# Patient Record
Sex: Male | Born: 1960 | Race: White | Hispanic: No | Smoking: Never smoker
Health system: Southern US, Community
[De-identification: ages and names within clinical notes are randomized; demographics above are authoritative.]

## PROBLEM LIST (undated history)

## (undated) DIAGNOSIS — K56609 Unspecified intestinal obstruction, unspecified as to partial versus complete obstruction: Secondary | ICD-10-CM

## (undated) DIAGNOSIS — E785 Hyperlipidemia, unspecified: Secondary | ICD-10-CM

## (undated) DIAGNOSIS — I10 Essential (primary) hypertension: Secondary | ICD-10-CM

## (undated) DIAGNOSIS — R9431 Abnormal electrocardiogram [ECG] [EKG]: Secondary | ICD-10-CM

## (undated) HISTORY — DX: Essential (primary) hypertension: I10

## (undated) HISTORY — DX: Abnormal electrocardiogram (ECG) (EKG): R94.31

## (undated) HISTORY — PX: VASECTOMY: SHX75

## (undated) HISTORY — DX: Hyperlipidemia, unspecified: E78.5

## (undated) HISTORY — DX: Unspecified intestinal obstruction, unspecified as to partial versus complete obstruction: K56.609

---

## 2003-12-13 ENCOUNTER — Emergency Department (HOSPITAL_COMMUNITY): Admission: EM | Admit: 2003-12-13 | Discharge: 2003-12-13 | Payer: Self-pay | Admitting: Emergency Medicine

## 2014-03-01 ENCOUNTER — Ambulatory Visit (INDEPENDENT_AMBULATORY_CARE_PROVIDER_SITE_OTHER): Payer: BC Managed Care – PPO

## 2014-03-01 ENCOUNTER — Ambulatory Visit (INDEPENDENT_AMBULATORY_CARE_PROVIDER_SITE_OTHER): Payer: BC Managed Care – PPO | Admitting: Podiatry

## 2014-03-01 ENCOUNTER — Encounter: Payer: Self-pay | Admitting: Podiatry

## 2014-03-01 VITALS — BP 178/92 | HR 58 | Resp 16 | Ht 68.0 in | Wt 183.0 lb

## 2014-03-01 DIAGNOSIS — M216X9 Other acquired deformities of unspecified foot: Secondary | ICD-10-CM

## 2014-03-01 DIAGNOSIS — M779 Enthesopathy, unspecified: Secondary | ICD-10-CM

## 2014-03-01 DIAGNOSIS — Q667 Congenital pes cavus: Secondary | ICD-10-CM

## 2014-03-01 MED ORDER — TRIAMCINOLONE ACETONIDE 10 MG/ML IJ SUSP
10.0000 mg | Freq: Once | INTRAMUSCULAR | Status: AC
  Administered 2014-03-01: 10 mg

## 2014-03-01 NOTE — Progress Notes (Signed)
Subjective:     Patient ID: Devon Riley, male   DOB: 02/10/1961, 53 y.o.   MRN: [id number]  Foot Pain  Patient presents stating I have a lot of pain under my 5th metatarsal of my left foot. States it has been present for about 4 months and getting worse   Review of Systems  All other systems reviewed and are negative.      Objective:   Physical Exam  Nursing note and vitals reviewed. Constitutional: He is oriented to person, place, and time.  Cardiovascular: Intact distal pulses.   Musculoskeletal: Normal range of motion.  Neurological: He is oriented to person, place, and time.  Skin: Skin is warm.  Neurovascular status is intact with normal range of motion and muscle strength. Patient is well oriented x3 and has good digital perfusion. He has pain with swelling under the 5th metatarsal left foot with lesion formation.      Assessment:     Plantarflexed 5th metatarsal left with inflamed capsule    Plan:     h &p performed and xrays reviewed. Injection of left 5th met with 3mg. Of steroid and xylocaine. After numbness obtained patient had area trimmed and will reappoint as needed.      

## 2014-03-01 NOTE — Progress Notes (Signed)
   Subjective:    Patient ID: Devon RumpJames W Riley, male    DOB: 01/22/1961, 53 y.o.   MRN: [id number]  HPI Comments: "I have something on the bottom of my foot"  Patient c/o burning sub 5th MPJ left for about 4 months. The area is callused. Getting more painful when walking. No home treatment.  Foot Pain      Review of Systems  Musculoskeletal: Positive for gait problem.  All other systems reviewed and are negative.      Objective:   Physical Exam        Assessment & Plan:

## 2016-05-27 DIAGNOSIS — I10 Essential (primary) hypertension: Secondary | ICD-10-CM | POA: Diagnosis present

## 2016-08-11 ENCOUNTER — Emergency Department (HOSPITAL_COMMUNITY)
Admission: EM | Admit: 2016-08-11 | Discharge: 2016-08-11 | Disposition: A | Discharge status: Home / Self Care | Payer: BLUE CROSS/BLUE SHIELD | Source: Home / Self Care | Attending: Emergency Medicine | Admitting: Emergency Medicine

## 2016-08-11 ENCOUNTER — Encounter (HOSPITAL_COMMUNITY): Payer: Self-pay

## 2016-08-11 ENCOUNTER — Emergency Department (HOSPITAL_COMMUNITY): Payer: BLUE CROSS/BLUE SHIELD

## 2016-08-11 ENCOUNTER — Encounter: Payer: Self-pay | Admitting: Gastroenterology

## 2016-08-11 ENCOUNTER — Encounter (HOSPITAL_COMMUNITY): Payer: Self-pay | Admitting: Emergency Medicine

## 2016-08-11 DIAGNOSIS — Z79899 Other long term (current) drug therapy: Secondary | ICD-10-CM | POA: Insufficient documentation

## 2016-08-11 DIAGNOSIS — I10 Essential (primary) hypertension: Secondary | ICD-10-CM | POA: Diagnosis not present

## 2016-08-11 DIAGNOSIS — K219 Gastro-esophageal reflux disease without esophagitis: Secondary | ICD-10-CM | POA: Insufficient documentation

## 2016-08-11 DIAGNOSIS — R1010 Upper abdominal pain, unspecified: Secondary | ICD-10-CM

## 2016-08-11 DIAGNOSIS — R001 Bradycardia, unspecified: Secondary | ICD-10-CM | POA: Diagnosis not present

## 2016-08-11 DIAGNOSIS — K529 Noninfective gastroenteritis and colitis, unspecified: Principal | ICD-10-CM | POA: Insufficient documentation

## 2016-08-11 LAB — COMPREHENSIVE METABOLIC PANEL
ALK PHOS: 43 U/L (ref 38–126)
ALT: 19 U/L (ref 17–63)
ALT: 18 U/L (ref 17–63)
AST: 24 U/L (ref 15–41)
AST: 25 U/L (ref 15–41)
Albumin: 4.1 g/dL (ref 3.5–5.0)
Albumin: 4 g/dL (ref 3.5–5.0)
Alkaline Phosphatase: 42 U/L (ref 38–126)
Anion gap: 10 (ref 5–15)
Anion gap: 7 (ref 5–15)
BILIRUBIN TOTAL: 1 mg/dL (ref 0.3–1.2)
BILIRUBIN TOTAL: 0.7 mg/dL (ref 0.3–1.2)
BUN: 11 mg/dL (ref 6–20)
BUN: 9 mg/dL (ref 6–20)
CALCIUM: 9.4 mg/dL (ref 8.9–10.3)
CALCIUM: 9 mg/dL (ref 8.9–10.3)
CHLORIDE: 106 mmol/L (ref 101–111)
CHLORIDE: 108 mmol/L (ref 101–111)
CO2: 21 mmol/L — AB (ref 22–32)
CO2: 23 mmol/L (ref 22–32)
CREATININE: 0.96 mg/dL (ref 0.61–1.24)
CREATININE: 1.04 mg/dL (ref 0.61–1.24)
GFR calc Af Amer: >60 mL/min (ref >60)
GLUCOSE: 117 mg/dL — AB (ref 65–99)
Glucose, Bld: 123 mg/dL — ABNORMAL HIGH (ref 65–99)
Potassium: 4 mmol/L (ref 3.5–5.1)
Potassium: 3.9 mmol/L (ref 3.5–5.1)
Sodium: 137 mmol/L (ref 135–145)
Sodium: 138 mmol/L (ref 135–145)
TOTAL PROTEIN: 7.6 g/dL (ref 6.5–8.1)
Total Protein: 7.3 g/dL (ref 6.5–8.1)

## 2016-08-11 LAB — URINALYSIS, ROUTINE W REFLEX MICROSCOPIC
BACTERIA UA: NONE SEEN
BILIRUBIN URINE: NEGATIVE
GLUCOSE, UA: NEGATIVE mg/dL
Glucose, UA: NEGATIVE mg/dL
HGB URINE DIPSTICK: NEGATIVE
Hgb urine dipstick: NEGATIVE
Ketones, ur: 15 mg/dL — AB
Ketones, ur: NEGATIVE mg/dL
LEUKOCYTES UA: NEGATIVE
Leukocytes, UA: NEGATIVE
Nitrite: NEGATIVE
Nitrite: NEGATIVE
PROTEIN: NEGATIVE mg/dL
PROTEIN: 30 mg/dL — AB
RBC / HPF: NONE SEEN RBC/hpf (ref 0–5)
SPECIFIC GRAVITY, URINE: 1.042 — AB (ref 1.005–1.030)
SQUAMOUS EPITHELIAL / LPF: NONE SEEN
pH: 5.5 (ref 5.0–8.0)
pH: 5 (ref 5.0–8.0)

## 2016-08-11 LAB — CBC
HCT: 39.6 % (ref 39.0–52.0)
HEMATOCRIT: 38.2 % — AB (ref 39.0–52.0)
HEMOGLOBIN: 13.3 g/dL (ref 13.0–17.0)
Hemoglobin: 13.7 g/dL (ref 13.0–17.0)
MCH: 32.4 pg (ref 26.0–34.0)
MCH: 32.9 pg (ref 26.0–34.0)
MCHC: 34.6 g/dL (ref 30.0–36.0)
MCHC: 34.8 g/dL (ref 30.0–36.0)
MCV: 93.6 fL (ref 78.0–100.0)
MCV: 94.6 fL (ref 78.0–100.0)
PLATELETS: 222 10*3/uL (ref 150–400)
Platelets: 219 10*3/uL (ref 150–400)
RBC: 4.23 MIL/uL (ref 4.22–5.81)
RBC: 4.04 MIL/uL — ABNORMAL LOW (ref 4.22–5.81)
RDW: 13.4 % (ref 11.5–15.5)
RDW: 13.3 % (ref 11.5–15.5)
WBC: 8.9 10*3/uL (ref 4.0–10.5)
WBC: 8.7 10*3/uL (ref 4.0–10.5)

## 2016-08-11 LAB — LIPASE, BLOOD
LIPASE: 21 U/L (ref 11–51)
Lipase: 21 U/L (ref 11–51)

## 2016-08-11 MED ORDER — ONDANSETRON HCL 4 MG/2ML IJ SOLN
4.0000 mg | Freq: Once | INTRAMUSCULAR | Status: AC
  Administered 2016-08-11: 4 mg via INTRAVENOUS
  Filled 2016-08-11: qty 2

## 2016-08-11 MED ORDER — PANTOPRAZOLE SODIUM 40 MG PO TBEC: 40.0000 mg | DELAYED_RELEASE_TABLET | Freq: Every day | ORAL | 0 refills | Status: DC

## 2016-08-11 MED ORDER — ALUM & MAG HYDROXIDE-SIMETH 200-200-20 MG/5ML PO SUSP
30.0000 mL | Freq: Once | ORAL | Status: AC
  Administered 2016-08-11: 30 mL via ORAL
  Filled 2016-08-11: qty 30

## 2016-08-11 MED ORDER — IOPAMIDOL (ISOVUE-300) INJECTION 61%
INTRAVENOUS | Status: AC
  Administered 2016-08-11: 100 mL
  Filled 2016-08-11: qty 100

## 2016-08-11 MED ORDER — SODIUM CHLORIDE 0.9 % IV BOLUS (SEPSIS)
1000.0000 mL | Freq: Once | INTRAVENOUS | Status: AC
  Administered 2016-08-11: 1000 mL via INTRAVENOUS

## 2016-08-11 MED ORDER — HYDROMORPHONE HCL 1 MG/ML IJ SOLN
1.0000 mg | Freq: Once | INTRAMUSCULAR | Status: AC
  Administered 2016-08-11: 1 mg via INTRAMUSCULAR
  Filled 2016-08-11: qty 1

## 2016-08-11 MED ORDER — FAMOTIDINE IN NACL 20-0.9 MG/50ML-% IV SOLN
20.0000 mg | Freq: Once | INTRAVENOUS | Status: AC
  Administered 2016-08-11: 20 mg via INTRAVENOUS
  Filled 2016-08-11: qty 50

## 2016-08-11 NOTE — Discharge Instructions (Signed)
It was our pleasure to provide your ER care today - we hope that you feel better.  Drink plenty of fluids.  Try taking protonix (acid blocker medication).  You may also try maalox or mylanta for symptom relief.  Follow up with GI specialist in the next couple weeks - call office to arrange appointment.  Return to ER if worse, new symptoms, fevers, persistent vomiting, severe pain, other concern.   You were given pain medication in the ER - no driving for the next 4 hours.

## 2016-08-11 NOTE — ED Notes (Signed)
Patient to CT.

## 2016-08-11 NOTE — ED Triage Notes (Signed)
Pt was seen today for same sx of server abdominal cramping but was not given a dx; pt states cramping has not decreased since discharge home; pt c/o pain at 7/10 on arrival. Pt able to ambulate an speaking in full and complete sentences; pt c/o nausea and diarrhea; pt has negative CT scan today;

## 2016-08-11 NOTE — ED Triage Notes (Signed)
Pt states abdominal cramping began yesterday morning upon waking. Pt endorses nausea and diarrhea, denies vomiting.

## 2016-08-11 NOTE — ED Provider Notes (Signed)
[facility]EMERGENCY DEPT Provider Note   CSN: [id number] Arrival date & time: 08/11/16  4098     History   Chief Complaint Chief Complaint  Patient presents with  . Abdominal Pain    HPI HANZEL PIZZO is a 56 y.o. male.  Patient c/o abdominal pain and nausea, onset yesterday AM.  Did have 2 loose bms/diarrhea, no severe diarrhea. Pain is dull, cramping, moderate, persistent, worse today. Pain is diffuse, but worse towards upper abdomen. No hx pud, gallstones, or pancreatitis. No prior abd surgery. No back or flank pain. No radiation. Denies cp or sob. No fever or chills. Decreased appetite last PM.   The history is provided by the patient.  Abdominal Pain   Associated symptoms include diarrhea and nausea. Pertinent negatives include fever, dysuria and headaches.    History reviewed. No pertinent past medical history.  There are no active problems to display for this patient.   Past Surgical History:  Procedure Laterality Date  . VASECTOMY         Home Medications    Prior to Admission medications   Not on File    Family History No family history on file.  Social History Social History  Substance Use Topics  . Smoking status: Never Smoker  . Smokeless tobacco: Never Used  . Alcohol use Yes     Comment: occassional     Allergies   Patient has no known allergies.   Review of Systems Review of Systems  Constitutional: Negative for fever.  HENT: Negative for sore throat.   Eyes: Negative for redness.  Respiratory: Negative for shortness of breath.   Cardiovascular: Negative for chest pain.  Gastrointestinal: Positive for abdominal pain, diarrhea and nausea.  Genitourinary: Negative for dysuria and flank pain.  Musculoskeletal: Negative for back pain and neck pain.  Skin: Negative for rash.  Neurological: Negative for headaches.  Hematological: Does not bruise/bleed easily.  Psychiatric/Behavioral: Negative for confusion.     Physical Exam Updated  Vital Signs BP 139/77 (BP Location: Left Arm)   Pulse 77   Temp 98.3 F (36.8 C) (Oral)   Resp (!) 174   Ht  (1.727 m)   Wt 79.4 kg   SpO2 97%   BMI 26.61 kg/m   Physical Exam  Constitutional: He is oriented to person, place, and time. He appears well-developed and well-nourished. No distress.  HENT:  Mouth/Throat: Oropharynx is clear and moist.  Eyes: Conjunctivae are normal.  Neck: Neck supple. No tracheal deviation present.  Cardiovascular: Normal rate, regular rhythm, normal heart sounds and intact distal pulses.  Exam reveals no gallop and no friction rub.   No murmur heard. Pulmonary/Chest: Effort normal and breath sounds normal. No accessory muscle usage. No respiratory distress.  Abdominal: Soft. Bowel sounds are normal. He exhibits no distension and no mass. There is tenderness. There is no rebound and no guarding. No hernia.  Mid and upper abd tenderness, moderate.   Genitourinary:  Genitourinary Comments: No cva tenderness. No scrotal or testicular pain/swelling/tenderness.   Musculoskeletal: He exhibits no edema.  Neurological: He is alert and oriented to person, place, and time.  Skin: Skin is warm and dry. He is not diaphoretic.  Psychiatric: He has a normal mood and affect.  Nursing note and vitals reviewed.    ED Treatments / Results  Labs (all labs ordered are listed, but only abnormal results are displayed) Results for orders placed or performed during the hospital encounter of 08/11/16  Lipase, blood  Result Value  Ref Range   Lipase 21 11 - 51 U/L  Comprehensive metabolic panel  Result Value Ref Range   Sodium 137 135 - 145 mmol/L   Potassium 4.0 3.5 - 5.1 mmol/L   Chloride 106 101 - 111 mmol/L   CO2 21 (L) 22 - 32 mmol/L   Glucose, Bld 117 (H) 65 - 99 mg/dL   BUN 11 6 - 20 mg/dL   Creatinine, Ser 7.82 0.61 - 1.24 mg/dL   Calcium 9.4 8.9 - 95.6 mg/dL   Total Protein 7.3 6.5 - 8.1 g/dL   Albumin 4.1 3.5 - 5.0 g/dL   AST 24 15 - 41 U/L   ALT  19 17 - 63 U/L   Alkaline Phosphatase 43 38 - 126 U/L   Total Bilirubin 1.0 0.3 - 1.2 mg/dL   GFR calc non Af Amer >60 >60 mL/min   GFR calc Af Amer >60 >60 mL/min   Anion gap 10 5 - 15  CBC  Result Value Ref Range   WBC 8.9 4.0 - 10.5 K/uL   RBC 4.23 4.22 - 5.81 MIL/uL   Hemoglobin 13.7 13.0 - 17.0 g/dL   HCT 21.3 08.6 - 57.8 %   MCV 93.6 78.0 - 100.0 fL   MCH 32.4 26.0 - 34.0 pg   MCHC 34.6 30.0 - 36.0 g/dL   RDW 46.9 62.9 - 52.8 %   Platelets 222 150 - 400 K/uL  Urinalysis, Routine w reflex microscopic  Result Value Ref Range   Color, Urine YELLOW YELLOW   APPearance HAZY (A) CLEAR   Specific Gravity, Urine >1.030 (H) 1.005 - 1.030   pH 5.5 5.0 - 8.0   Glucose, UA NEGATIVE NEGATIVE mg/dL   Hgb urine dipstick NEGATIVE NEGATIVE   Bilirubin Urine SMALL (A) NEGATIVE   Ketones, ur 15 (A) NEGATIVE mg/dL   Protein, ur NEGATIVE NEGATIVE mg/dL   Nitrite NEGATIVE NEGATIVE   Leukocytes, UA NEGATIVE NEGATIVE   Ct Abdomen Pelvis W Contrast  Result Date: 08/11/2016 CLINICAL DATA:  Mid to lower abdominal pain.  Nausea, vomiting. EXAM: CT ABDOMEN AND PELVIS WITH CONTRAST TECHNIQUE: Multidetector CT imaging of the abdomen and pelvis was performed using the standard protocol following bolus administration of intravenous contrast. CONTRAST:  ISOVUE-300 IOPAMIDOL (ISOVUE-300) INJECTION 61% COMPARISON:  CT 12/13/2003 FINDINGS: Lower chest: Minimal dependent atelectasis in the lung bases. No effusions. Heart is normal size. Hepatobiliary: No focal hepatic abnormality. Gallbladder unremarkable. Pancreas: No focal abnormality or ductal dilatation. Spleen: No focal abnormality.  Normal size. Adrenals/Urinary Tract: No adrenal abnormality. No focal renal abnormality. No stones or hydronephrosis. Urinary bladder is unremarkable. Stomach/Bowel: Appendix is visualized and is normal. Mildly prominent small bowel loops in the mid and lower abdomen and right lower quadrant. Distal ileum is decompressed. No  well-defined transition zone, but is difficult to completely exclude early low grade small bowel obstruction. Stomach is unremarkable. Vascular/Lymphatic: No evidence of aneurysm or adenopathy. Reproductive: No visible focal abnormality. Other: Trace free fluid noted in the cul-de-sac and right paracolic gutter. No free air. Musculoskeletal: No acute bony abnormality. IMPRESSION: Mildly prominent fluid and gas-filled small bowel loops in the mid and lower abdomen and right lower quadrant. There is no well-defined transition zone, but the distal ileum is decompressed. Cannot exclude early low grade small bowel obstruction. Trace free fluid in the abdomen and pelvis. Electronically Signed   By: Charlett Nose M.D.   On: 08/11/2016 08:53    EKG  EKG Interpretation None  Radiology Ct Abdomen Pelvis W Contrast  Result Date: 08/11/2016 CLINICAL DATA:  Mid to lower abdominal pain.  Nausea, vomiting. EXAM: CT ABDOMEN AND PELVIS WITH CONTRAST TECHNIQUE: Multidetector CT imaging of the abdomen and pelvis was performed using the standard protocol following bolus administration of intravenous contrast. CONTRAST:  ISOVUE-300 IOPAMIDOL (ISOVUE-300) INJECTION 61% COMPARISON:  CT 12/13/2003 FINDINGS: Lower chest: Minimal dependent atelectasis in the lung bases. No effusions. Heart is normal size. Hepatobiliary: No focal hepatic abnormality. Gallbladder unremarkable. Pancreas: No focal abnormality or ductal dilatation. Spleen: No focal abnormality.  Normal size. Adrenals/Urinary Tract: No adrenal abnormality. No focal renal abnormality. No stones or hydronephrosis. Urinary bladder is unremarkable. Stomach/Bowel: Appendix is visualized and is normal. Mildly prominent small bowel loops in the mid and lower abdomen and right lower quadrant. Distal ileum is decompressed. No well-defined transition zone, but is difficult to completely exclude early low grade small bowel obstruction. Stomach is unremarkable.  Vascular/Lymphatic: No evidence of aneurysm or adenopathy. Reproductive: No visible focal abnormality. Other: Trace free fluid noted in the cul-de-sac and right paracolic gutter. No free air. Musculoskeletal: No acute bony abnormality. IMPRESSION: Mildly prominent fluid and gas-filled small bowel loops in the mid and lower abdomen and right lower quadrant. There is no well-defined transition zone, but the distal ileum is decompressed. Cannot exclude early low grade small bowel obstruction. Trace free fluid in the abdomen and pelvis. Electronically Signed   By: Charlett Nose M.D.   On: 08/11/2016 08:53    Procedures Procedures (including critical care time)  Medications Ordered in ED Medications  sodium chloride 0.9 % bolus 1,000 mL (not administered)  famotidine (PEPCID) IVPB 20 mg premix (not administered)  ondansetron (ZOFRAN) injection 4 mg (not administered)  HYDROmorphone (DILAUDID) injection 1 mg (not administered)     Initial Impression / Assessment and Plan / ED Course  I have reviewed the triage vital signs and the nursing notes.  Pertinent labs & imaging results that were available during my care of the patient were reviewed by me and considered in my medical decision making (see chart for details).  Iv ns bolus. Labs.  pepcid iv, zofran iv. Dilaudid 1 mg iv.  CT abd/pelvis.   Reviewed nursing notes and prior charts for additional history.   CT neg acute. Mildly dilated loops small bowel noted. No prior abd surgery. No incarc hernia.  No abd distension or emesis.   maalox po.  Po fluids.  Pt feels improved from prior.   Patient's spouse indicates hx intermittently c/o abd pain post eating (for long while). Will refer to outpt gi f/u.  Patient currently appears stable for d/c.     Final Clinical Impressions(s) / ED Diagnoses   Final diagnoses:  None    New Prescriptions New Prescriptions   No medications on file     Cathren Laine, MD 08/11/16 [phone]

## 2016-08-12 ENCOUNTER — Observation Stay (HOSPITAL_COMMUNITY)
Admission: EM | Admit: 2016-08-12 | Discharge: 2016-08-13 | Disposition: A | Discharge status: Home / Self Care | Payer: BLUE CROSS/BLUE SHIELD | Attending: Oncology | Admitting: Oncology

## 2016-08-12 ENCOUNTER — Emergency Department (HOSPITAL_COMMUNITY): Payer: BLUE CROSS/BLUE SHIELD

## 2016-08-12 DIAGNOSIS — I10 Essential (primary) hypertension: Secondary | ICD-10-CM | POA: Diagnosis not present

## 2016-08-12 DIAGNOSIS — Z87891 Personal history of nicotine dependence: Secondary | ICD-10-CM | POA: Diagnosis not present

## 2016-08-12 DIAGNOSIS — K56609 Unspecified intestinal obstruction, unspecified as to partial versus complete obstruction: Secondary | ICD-10-CM

## 2016-08-12 DIAGNOSIS — K529 Noninfective gastroenteritis and colitis, unspecified: Secondary | ICD-10-CM | POA: Diagnosis not present

## 2016-08-12 DIAGNOSIS — R001 Bradycardia, unspecified: Secondary | ICD-10-CM

## 2016-08-12 DIAGNOSIS — K56600 Partial intestinal obstruction, unspecified as to cause: Secondary | ICD-10-CM | POA: Diagnosis present

## 2016-08-12 LAB — C DIFFICILE QUICK SCREEN W PCR REFLEX
C DIFFICILE (CDIFF) INTERP: NOT DETECTED
C Diff antigen: NEGATIVE
C Diff toxin: NEGATIVE

## 2016-08-12 MED ORDER — DICYCLOMINE HCL 10 MG PO CAPS
20.0000 mg | ORAL_CAPSULE | Freq: Four times a day (QID) | ORAL | Status: DC | PRN
  Administered 2016-08-12: 20 mg via ORAL
  Filled 2016-08-12: qty 2

## 2016-08-12 MED ORDER — ONDANSETRON HCL 4 MG/2ML IJ SOLN: 4.0000 mg | Freq: Four times a day (QID) | INTRAMUSCULAR | Status: DC | PRN

## 2016-08-12 MED ORDER — ONDANSETRON HCL 4 MG PO TABS: 4.0000 mg | ORAL_TABLET | Freq: Four times a day (QID) | ORAL | Status: DC | PRN

## 2016-08-12 MED ORDER — ENOXAPARIN SODIUM 40 MG/0.4ML ~~LOC~~ SOLN
40.0000 mg | SUBCUTANEOUS | Status: DC
  Administered 2016-08-12: 40 mg via SUBCUTANEOUS
  Filled 2016-08-12: qty 0.4

## 2016-08-12 MED ORDER — SODIUM CHLORIDE 0.9 % IV SOLN
INTRAVENOUS | Status: AC
  Administered 2016-08-12: 22:00:00 via INTRAVENOUS

## 2016-08-12 MED ORDER — POTASSIUM CHLORIDE IN NACL 20-0.9 MEQ/L-% IV SOLN
INTRAVENOUS | Status: AC
  Administered 2016-08-12 (×2): via INTRAVENOUS
  Filled 2016-08-12 (×3): qty 1000

## 2016-08-12 MED ORDER — HYDROMORPHONE HCL 1 MG/ML IJ SOLN: 0.5000 mg | INTRAMUSCULAR | Status: DC | PRN

## 2016-08-12 MED ORDER — SODIUM CHLORIDE 0.9% FLUSH
3.0000 mL | Freq: Two times a day (BID) | INTRAVENOUS | Status: DC
  Administered 2016-08-13: 3 mL via INTRAVENOUS

## 2016-08-12 MED ORDER — HYOSCYAMINE SULFATE ER 0.375 MG PO TB12
0.3750 mg | ORAL_TABLET | Freq: Two times a day (BID) | ORAL | Status: DC
  Administered 2016-08-12: 0.375 mg via ORAL
  Filled 2016-08-12 (×2): qty 1

## 2016-08-12 MED ORDER — HYDROMORPHONE HCL 1 MG/ML IJ SOLN
1.0000 mg | Freq: Once | INTRAMUSCULAR | Status: AC
  Administered 2016-08-12: 1 mg via INTRAVENOUS
  Filled 2016-08-12: qty 1

## 2016-08-12 MED ORDER — SODIUM CHLORIDE 0.9 % IV BOLUS (SEPSIS)
1000.0000 mL | Freq: Once | INTRAVENOUS | Status: AC
  Administered 2016-08-12: 1000 mL via INTRAVENOUS

## 2016-08-12 MED ORDER — DICYCLOMINE HCL 10 MG PO CAPS: 20.0000 mg | ORAL_CAPSULE | Freq: Three times a day (TID) | ORAL | Status: DC

## 2016-08-12 MED ORDER — DICYCLOMINE HCL 10 MG PO CAPS
20.0000 mg | ORAL_CAPSULE | Freq: Once | ORAL | Status: AC
  Administered 2016-08-12: 20 mg via ORAL
  Filled 2016-08-12: qty 2

## 2016-08-12 MED ORDER — DICYCLOMINE HCL 10 MG PO CAPS: 20.0000 mg | ORAL_CAPSULE | Freq: Once | ORAL | Status: DC

## 2016-08-12 MED ORDER — SUCRALFATE 1 G PO TABS
1.0000 g | ORAL_TABLET | Freq: Three times a day (TID) | ORAL | Status: DC
  Administered 2016-08-12: 1 g via ORAL
  Filled 2016-08-12: qty 1

## 2016-08-12 NOTE — ED Provider Notes (Signed)
[facility]PT Provider Note   CSN: [id number] Arrival date & time: 08/11/16  2115     History   Chief Complaint Chief Complaint  Patient presents with  . Abdominal Cramping    HPI Devon Riley is a 56 y.o. male.  Patient without significant medical history presents with upper abdominal cramping-type pain. He was seen and evaluated yesterday in the ED and reports his pain continued after getting home. No fever. He has had nausea without vomiting and diarrhea that has been non-bloody. The patient and wife tried to contact GI as referred and closest appointment is May 7th. The pain intensified at home to the point they were concerned, prompting return visit.    The history is provided by the patient. No language interpreter was used.  Abdominal Cramping  Associated symptoms include abdominal pain. Pertinent negatives include no chest pain and no shortness of breath.    History reviewed. No pertinent past medical history.  There are no active problems to display for this patient.   Past Surgical History:  Procedure Laterality Date  . VASECTOMY         Home Medications    Prior to Admission medications   Medication Sig Start Date End Date Taking? Authorizing Provider  pantoprazole (PROTONIX) 40 MG tablet Take 1 tablet (40 mg total) by mouth daily. 08/11/16   Cathren Laine, MD    Family History No family history on file.  Social History Social History  Substance Use Topics  . Smoking status: Never Smoker  . Smokeless tobacco: Never Used  . Alcohol use Yes     Comment: occassional     Allergies   Patient has no known allergies.   Review of Systems Review of Systems  Constitutional: Negative for chills, diaphoresis and fever.  Respiratory: Negative.  Negative for shortness of breath.   Cardiovascular: Negative.  Negative for chest pain.  Gastrointestinal: Positive for abdominal pain, diarrhea and nausea. Negative for blood in stool and vomiting.    Genitourinary: Negative.  Negative for decreased urine volume and dysuria.  Musculoskeletal: Negative.  Negative for back pain and myalgias.  Neurological: Negative.  Negative for weakness and light-headedness.     Physical Exam Updated Vital Signs BP 133/82   Pulse 65   Temp 98 F (36.7 C) (Oral)   Resp 18   SpO2 99%   Physical Exam  Constitutional: He is oriented to person, place, and time. He appears well-developed and well-nourished.  HENT:  Head: Normocephalic.  Neck: Normal range of motion. Neck supple.  Cardiovascular: Normal rate and regular rhythm.   Pulmonary/Chest: Effort normal and breath sounds normal. He has no wheezes. He has no rales.  Abdominal: Soft. Bowel sounds are normal. He exhibits no distension and no mass. There is tenderness (Epigastric tenderness. ). There is no rebound and no guarding.  Musculoskeletal: Normal range of motion.  Neurological: He is alert and oriented to person, place, and time.  Skin: Skin is warm and dry. No rash noted.  Psychiatric: He has a normal mood and affect.     ED Treatments / Results  Labs (all labs ordered are listed, but only abnormal results are displayed) Labs Reviewed  COMPREHENSIVE METABOLIC PANEL - Abnormal; Notable for the following:       Result Value   Glucose, Bld 123 (*)    All other components within normal limits  CBC - Abnormal; Notable for the following:    RBC 4.04 (*)    HCT 38.2 (*)  All other components within normal limits  URINALYSIS, ROUTINE W REFLEX MICROSCOPIC - Abnormal; Notable for the following:    Color, Urine AMBER (*)    APPearance CLOUDY (*)    Specific Gravity, Urine 1.042 (*)    Protein, ur 30 (*)    All other components within normal limits  LIPASE, BLOOD   Results for orders placed or performed during the hospital encounter of 08/12/16  Lipase, blood  Result Value Ref Range   Lipase 21 11 - 51 U/L  Comprehensive metabolic panel  Result Value Ref Range   Sodium 138 135  - 145 mmol/L   Potassium 3.9 3.5 - 5.1 mmol/L   Chloride 108 101 - 111 mmol/L   CO2 23 22 - 32 mmol/L   Glucose, Bld 123 (H) 65 - 99 mg/dL   BUN 9 6 - 20 mg/dL   Creatinine, Ser 1.61 0.61 - 1.24 mg/dL   Calcium 9.0 8.9 - 09.6 mg/dL   Total Protein 7.6 6.5 - 8.1 g/dL   Albumin 4.0 3.5 - 5.0 g/dL   AST 25 15 - 41 U/L   ALT 18 17 - 63 U/L   Alkaline Phosphatase 42 38 - 126 U/L   Total Bilirubin 0.7 0.3 - 1.2 mg/dL   GFR calc non Af Amer >60 >60 mL/min   GFR calc Af Amer >60 >60 mL/min   Anion gap 7 5 - 15  CBC  Result Value Ref Range   WBC 8.7 4.0 - 10.5 K/uL   RBC 4.04 (L) 4.22 - 5.81 MIL/uL   Hemoglobin 13.3 13.0 - 17.0 g/dL   HCT 04.5 (L) 40.9 - 81.1 %   MCV 94.6 78.0 - 100.0 fL   MCH 32.9 26.0 - 34.0 pg   MCHC 34.8 30.0 - 36.0 g/dL   RDW 91.4 78.2 - 95.6 %   Platelets 219 150 - 400 K/uL  Urinalysis, Routine w reflex microscopic  Result Value Ref Range   Color, Urine AMBER (A) YELLOW   APPearance CLOUDY (A) CLEAR   Specific Gravity, Urine 1.042 (H) 1.005 - 1.030   pH 5.0 5.0 - 8.0   Glucose, UA NEGATIVE NEGATIVE mg/dL   Hgb urine dipstick NEGATIVE NEGATIVE   Bilirubin Urine NEGATIVE NEGATIVE   Ketones, ur NEGATIVE NEGATIVE mg/dL   Protein, ur 30 (A) NEGATIVE mg/dL   Nitrite NEGATIVE NEGATIVE   Leukocytes, UA NEGATIVE NEGATIVE   RBC / HPF NONE SEEN 0 - 5 RBC/hpf   WBC, UA 0-5 0 - 5 WBC/hpf   Bacteria, UA NONE SEEN NONE SEEN   Squamous Epithelial / LPF NONE SEEN NONE SEEN   Mucous PRESENT     EKG  EKG Interpretation None       Radiology Ct Abdomen Pelvis W Contrast  Result Date: 08/11/2016 CLINICAL DATA:  Mid to lower abdominal pain.  Nausea, vomiting. EXAM: CT ABDOMEN AND PELVIS WITH CONTRAST TECHNIQUE: Multidetector CT imaging of the abdomen and pelvis was performed using the standard protocol following bolus administration of intravenous contrast. CONTRAST:  ISOVUE-300 IOPAMIDOL (ISOVUE-300) INJECTION 61% COMPARISON:  CT 12/13/2003 FINDINGS: Lower  chest: Minimal dependent atelectasis in the lung bases. No effusions. Heart is normal size. Hepatobiliary: No focal hepatic abnormality. Gallbladder unremarkable. Pancreas: No focal abnormality or ductal dilatation. Spleen: No focal abnormality.  Normal size. Adrenals/Urinary Tract: No adrenal abnormality. No focal renal abnormality. No stones or hydronephrosis. Urinary bladder is unremarkable. Stomach/Bowel: Appendix is visualized and is normal. Mildly prominent small bowel loops in the mid and  lower abdomen and right lower quadrant. Distal ileum is decompressed. No well-defined transition zone, but is difficult to completely exclude early low grade small bowel obstruction. Stomach is unremarkable. Vascular/Lymphatic: No evidence of aneurysm or adenopathy. Reproductive: No visible focal abnormality. Other: Trace free fluid noted in the cul-de-sac and right paracolic gutter. No free air. Musculoskeletal: No acute bony abnormality. IMPRESSION: Mildly prominent fluid and gas-filled small bowel loops in the mid and lower abdomen and right lower quadrant. There is no well-defined transition zone, but the distal ileum is decompressed. Cannot exclude early low grade small bowel obstruction. Trace free fluid in the abdomen and pelvis. Electronically Signed   By: Charlett Nose M.D.   On: 08/11/2016 08:53    Procedures Procedures (including critical care time)  Medications Ordered in ED Medications  hyoscyamine (LEVBID) 0.375 MG 12 hr tablet 0.375 mg (0.375 mg Oral Given 08/12/16 0245)  sucralfate (CARAFATE) tablet 1 g (1 g Oral Given 08/12/16 0308)     Initial Impression / Assessment and Plan / ED Course  I have reviewed the triage vital signs and the nursing notes.  Pertinent labs & imaging results that were available during my care of the patient were reviewed by me and considered in my medical decision making (see chart for details).     The patient returns with persistent abdominal cramping and pain.  Chart reviewed. CT scan essentially negative, "cannot exclude early low grade small bowel obstruction." The patient is not vomiting. He is passing "a lot of gas" and belching excessively. He reports the GI cocktail provided earlier provided temporary relief. He has not taken any protonix.   VSS. Levsin provided with some improvement. Discussed that the symptoms he is having describe gastritis, probably from his regular, daily use of NSAIDs, and not bowel obstruction as he is low risk and by clinical presentation. Patient and wife are reassured. Will give carafate and re-evaluate for control of pain at which time they would be comfortable going home and following up in the outpatient setting.  Pain continues to recur requiring multiple doses of various medications aimed at pain control. Plain film shows "diarrheal illness vs 'cannot r/o SOB'". Will admit patient for observation, pain control and repeat exams/imaging for further evaluation.  Final Clinical Impressions(s) / ED Diagnoses   Final diagnoses:  None   1. Abdominal pain  New Prescriptions New Prescriptions   No medications on file     Elpidio Anis, PA-C 08/21/16 2155    Layla Maw Ward, DO 08/21/16 2318

## 2016-08-12 NOTE — ED Notes (Signed)
Bedside toilet placed in pt room 

## 2016-08-12 NOTE — ED Notes (Signed)
ED Provider at bedside. 

## 2016-08-12 NOTE — ED Notes (Signed)
Nurse first rounds: nurse explained delay , process and wait time to pt. , resting with no distress/respirations unlabored .

## 2016-08-12 NOTE — H&P (Signed)
Date: 08/12/2016               Patient Name:  Devon Riley MRN: [id number]  DOB: 1960-09-01 Age / Sex: 56 y.o.,  male   PCP: [facility]         Medical Service: Internal Medicine Teaching Service         Attending Physician: Dr. Levert Feinstein, MD    First Contact: Dr. Antony Contras Pager: [phone]  Second Contact: Dr. Allena Katz Pager: [phone]       After Hours (After 5p/  First Contact Pager: [phone]  weekends / holidays): Second Contact Pager: [phone]   Chief Complaint: abdominal pain  History of Present Illness: Devon Riley is a 56 yo M with PMHx of HTN who presents to the ED with complaint of abdominal pain. 2 days ago patient developed nausea without vomiting. Later that afternoon, patient continued to experience nausea, but developed diarrhea and abdominal cramping. He reports he has had 10-15 episodes of diarrhea in the last 2 days. He describes the diarrhea as a mix of stool and water, nonbloody, associated with urgency. He denies evidence of undigested food or mucus in his stool. He admits to flatus and having to have an episode of diarrhea immediately after eating or drinking. He denies pain with defecation. He denies relief of abdominal cramping with defecation. He denies weight loss. He had a normal colonoscopy one year ago. He denies family history of colon cancer. He denies any recent travel or any sick contacts. He does have well water at home, but drinks bottled water. The night prior to developing nausea and diarrhea, patient felt well and was in his normal state of health. He and his girlfriend grilled out hamburgers and had tater tots. She does not have these symptoms. Patient describes his abdominal pain as a centralized, nonradiating, severe cramping pain. At its worst, pain as a 10/10. At its best after pain medication the pain is a 3/10. Eating and drinking makes the pain worse.  Patient denies any prior abdominal surgeries. He denies  any gastrointestinal issues other than reflux disease. He admits to associated fever, chills. He denies nasal congestion, sore throat, cough, shortness of breath, chest pain, vomiting, weakness, lightheadedness.  Patient was initially seen in the ED on 4/10 for the same symptoms. At that time, patient was afebrile, mildly hypertensive, normal heart rate, satting well on room air. Laboratory workup was within normal limits. Urinalysis was negative for infection. LFTs were normal and lipase normal. CT abdomen pelvis showed mildly prominent fluid and gas filled small bowel loops in the mid and lower abdomen and right lower quadrant. It could not exclude a low-grade small bowel obstruction. Gallbladder, liver, stomach, appendix, pancreas were all normal. Patient was discharged from the emergency department with Protonix. He returns today given on relieved, ongoing cramping abdominal pain and diarrhea. Again, patient is afebrile mildly hypertensive, laboratory workup within normal limits, normal LFTs, normal lipase, urinalysis without evidence of infection. Abdominal x-ray showed multiple dilated loops of small bowel with air-fluid levels which is likely secondary to acute diarrheal illness versus a developing small bowel obstruction. Patient was given Bentyl, Dilaudid in the emergency department.  Meds: Current Facility-Administered Medications  Medication Dose Route Frequency Provider Last Rate Last Dose  . 0.9 % NaCl with KCl 20 mEq/ L  infusion   Intravenous Continuous Alexa R Lawerance Bach, MD 100 mL/hr at 08/12/16 0941    . HYDROmorphone (DILAUDID) injection 0.5 mg  0.5 mg Intravenous Q4H PRN Servando Snare, MD       Current Outpatient Prescriptions  Medication Sig Dispense Refill  . pantoprazole (PROTONIX) 40 MG tablet Take 1 tablet (40 mg total) by mouth daily. 30 tablet 0    Allergies: Allergies as of 08/11/2016  . (No Known Allergies)   History reviewed. No pertinent past medical history.  Family  History:  Father: Bladder cancer Father: Lung cancer  Social History:  Tobacco Use: Minimal use 40 years ago for 2 years Alcohol Use: 8-10 beers on the weekends Illicit Drug Use: Denies  Review of Systems: A complete ROS was reviewed and negative except as per HPI.   Physical Exam: Blood pressure 101/78, pulse (!) 51, temperature 98 F (36.7 C), temperature source Oral, resp. rate 14, SpO2 100 %. General: Vital signs reviewed.  Patient is in no acute distress and cooperative with exam.  Eyes: PERRL, conjunctivae normal, no scleral icterus.  Ears, Nose, Throat, and Mouth: Normal bilateral nasal turbinates. Normal posterior oropharynx. Moist mucous membranes.  Cardiovascular: Bradycardic, regular rhythm,  no murmurs, gallops, or rubs. No JVD or carotid bruit present. No lower extremity edema bilaterally. Bilateral radial and pedal pulses are intact and symmetric bilaterally.  Pulmonary: Clear to auscultation bilaterally, no wheezes, rales, or rhonchi. No accessory muscle use. Gastrointestinal: Soft, tender to palpation in central and umbilical region, non-distended, hyperactive bowel sounds with tinkling high-pitched noises in left upper quadrant, no masses, organomegaly, or guarding present.  Neurologic: Awake and oriented. Moving all extremities equally Skin: Warm, dry and intact. No rashes or erythema. Psychiatric: Normal mood and affect. speech and behavior is normal. Cognition and memory are normal.   CT Abd/Pelvis: Mildly prominent fluid and gas-filled small bowel loops in the mid and lower abdomen and right lower quadrant. There is no well-defined transition zone, but the distal ileum is decompressed. Cannot exclude early low grade small bowel obstruction. Trace free fluid in the abdomen and pelvis.  AbXR: Multiple dilated loops of central abdominal small bowel with associated fluid levels. The findings are compatible with an acute diarrheal illness; however, a developing small  bowel obstruction would be difficult to exclude.  Assessment & Plan by Problem: Principal Problem:   Gastroenteritis Active Problems:   HTN (hypertension)   Bradycardia  Mr. Peruski is a 56 yo M with PMHx of HTN who presents to the ED with complaint of abdominal pain, nausea and diarrhea.  Acute Gastroenteritis: Patient presents with a 2 day history of nausea, diarrhea and cramping abdominal pain. Patient is afebrile and without leukocytosis. CT abdomen/pelvis showed dilated, fluid and gas filled small bowel loops with possible early low grade small bowel obstruction. Abdominal x-ray today continued to show dilated loops of small bowel. His presentation is most consistent with an acute diarrheal illness, but there is a possibility of an underlying developing small bowel obstruction. However, clinically patient does not appear to have a small bowel obstruction given continued bowel movements, passing gas and no vomiting. There is no evidence in his history, physical exam or clinical workup to suggest another etiology such as pancreatitis, gallbladder disease, liver disease, appendicitis or diverticulitis. C Diff negative.  -Admit to observation -IVF -Nothing by mouth for bowel rest -Pain control -Anti-emetics -Repeat abdominal x-ray tomorrow morning -If patient develops vomiting or ceases to have bowel movements, continue repeating abdominal x-ray earlier to rule out SBO. If SBO occurs in the setting of vomiting, would place NG tube -No need for antibiotics at this time  Hypertension:  Patient has a history of hypertension, previously controlled with lisinopril-hydrochlorothiazide. Patient later lost weight and was taken off of his antihypertensive, but was lost to follow-up. Patient has recently seen his PCP for monitoring of his hypertension. He is not currently on any medication. -Monitor BP  Bradycardia: Patient is bradycardic but regular rhythm on exam. He has asymptomatic. We will obtain  a baseline EKG. -Telemetry -EKG  DVT/PE ppx: Lovenox Code: Full FEN: IVF, nothing by mouth  Dispo: Admit patient to Observation with expected length of stay less than 2 midnights.  Signed: Karlene Lineman, DO PGY-3 Internal Medicine Resident Pager # [phone] 08/12/2016 11:27 AM

## 2016-08-12 NOTE — ED Notes (Signed)
Patient transported to X-ray 

## 2016-08-13 ENCOUNTER — Observation Stay (HOSPITAL_COMMUNITY): Payer: BLUE CROSS/BLUE SHIELD

## 2016-08-13 DIAGNOSIS — K219 Gastro-esophageal reflux disease without esophagitis: Secondary | ICD-10-CM | POA: Diagnosis not present

## 2016-08-13 DIAGNOSIS — R001 Bradycardia, unspecified: Secondary | ICD-10-CM | POA: Diagnosis not present

## 2016-08-13 DIAGNOSIS — I10 Essential (primary) hypertension: Secondary | ICD-10-CM | POA: Diagnosis not present

## 2016-08-13 DIAGNOSIS — K529 Noninfective gastroenteritis and colitis, unspecified: Secondary | ICD-10-CM | POA: Diagnosis not present

## 2016-08-13 LAB — BASIC METABOLIC PANEL
ANION GAP: 8 (ref 5–15)
BUN: 11 mg/dL (ref 6–20)
CALCIUM: 8.4 mg/dL — AB (ref 8.9–10.3)
CO2: 21 mmol/L — AB (ref 22–32)
CREATININE: 1 mg/dL (ref 0.61–1.24)
Chloride: 109 mmol/L (ref 101–111)
GLUCOSE: 84 mg/dL (ref 65–99)
Potassium: 4.2 mmol/L (ref 3.5–5.1)
Sodium: 138 mmol/L (ref 135–145)

## 2016-08-13 LAB — CBC
HCT: 34.5 % — ABNORMAL LOW (ref 39.0–52.0)
HEMOGLOBIN: 11.5 g/dL — AB (ref 13.0–17.0)
MCH: 31.7 pg (ref 26.0–34.0)
MCHC: 33.3 g/dL (ref 30.0–36.0)
MCV: 95 fL (ref 78.0–100.0)
PLATELETS: 183 10*3/uL (ref 150–400)
RBC: 3.63 MIL/uL — AB (ref 4.22–5.81)
RDW: 13.1 % (ref 11.5–15.5)
WBC: 6.1 10*3/uL (ref 4.0–10.5)

## 2016-08-13 LAB — HIV ANTIBODY (ROUTINE TESTING W REFLEX): HIV SCREEN 4TH GENERATION: NONREACTIVE

## 2016-08-13 MED ORDER — SODIUM CHLORIDE 0.9 % IV SOLN
1.0000 g | Freq: Once | INTRAVENOUS | Status: DC
  Filled 2016-08-13: qty 10

## 2016-08-13 MED ORDER — DICYCLOMINE HCL 10 MG PO CAPS: 20.0000 mg | ORAL_CAPSULE | Freq: Four times a day (QID) | ORAL | 0 refills | Status: DC | PRN

## 2016-08-13 NOTE — Progress Notes (Signed)
Discharge instructions given to patient and wife. Discharged home.

## 2016-08-13 NOTE — Discharge Summary (Signed)
Name: Devon Riley MRN: [id number] DOB: 10-05-1960 56 y.o. PCP: [facility]  Date of Admission: 08/12/2016 12:39 AM Date of Discharge: 08/13/2016 Attending Physician: Levert Feinstein, MD  Discharge Diagnosis: 1. Acute Gastroenteritis   Discharge Medications: Allergies as of 08/13/2016   No Known Allergies     Medication List    TAKE these medications   dicyclomine 10 MG capsule Commonly known as:  BENTYL Take 2 capsules (20 mg total) by mouth 4 (four) times daily as needed for spasms.   pantoprazole 40 MG tablet Commonly known as:  PROTONIX Take 1 tablet (40 mg total) by mouth daily.       Disposition and follow-up:   Mr.Effie W Quesnell was discharged from [facility] in Stable condition.  At the hospital follow up visit please address:  1.  Acute Gastroenteritis: Patient presented with 2 days of nausea, diarrhea, and cramping abdominal pain. Imaging was concerning for a developing SBO. He was admitted for bowel rest, pain control, and IVFs. Symptoms improved the following day. Repeat abdominal xray the next morning showed no evidence of SBO. Please follow up.   2.  Labs / imaging needed at time of follow-up: none   3.  Pending labs/ test needing follow-up: none  Follow-up Appointments: Follow-up Information    [facility] Follow up.   Specialty:  Family Medicine Why:  In 1-2 weeks for follow up for high blood pressure Contact information: [address] [place] [address] [phone]           Hospital Course by problem list:  1. Acute Gastroenteritis: Patient presented with a 2 day history of nausea, diarrhea, and cramping abdominal pain. He was seen in the ED the day before admission for the same complaint. At that time, patient was afebrile, mildly hypertensive, normal heart rate, satting well on room air. Laboratory workup was within normal limits. Urinalysis was  negative for infection. LFTs were normal and lipase normal. CT abdomen pelvis showed mildly prominent fluid and gas filled small bowel loops in the mid and lower abdomen and right lower quadrant. It could not exclude a low-grade small bowel obstruction. Gallbladder, liver, stomach, appendix, pancreas were all normal. Patient was discharged from the emergency department with Protonix. He returned the following day with ongoing cramping abdominal pain and diarrhea. Again, patient was afebrile mildly hypertensive, laboratory workup within normal limits, normal LFTs, normal lipase, urinalysis without evidence of infection. Abdominal x-ray showed multiple dilated loops of small bowel with air-fluid levels which is likely secondary to acute diarrheal illness versus a developing small bowel obstruction. On physical exam, patient had tenderness to palpation in the central and umbilical region. He was non-distended with hyperactive bowel sounds and tinkling high pitched noises in his left upper quadrant. Given the concern for a developing SBO, he was admitted for IVFs, pain control, and bowel rest.  Abdominal pain and diarrhea improved the following day. He remained afebrile. Repeat abdominal xray the following morning was negative for signs of bowel obtruction. He was discharged and instructed to follow up with his PCP. Return precautions given.   Discharge Vitals:   BP (!) 143/73 (BP Location: Right Arm)   Pulse 60   Temp 98.4 F (36.9 C) (Oral)   Resp 17   Ht  (1.727 m)   Wt 177 lb 9.6 oz (80.6 kg)   SpO2 100%   BMI 27.00 kg/m   Pertinent Labs, Studies, and  Procedures:   08/11/2016 CT Abdomen Pelvis W Contrast: IMPRESSION: Mildly prominent fluid and gas-filled small bowel loops in the mid and lower abdomen and right lower quadrant. There is no well-defined transition zone, but the distal ileum is decompressed. Cannot exclude early low grade small bowel obstruction. Trace free fluid in the abdomen  and pelvis.  08/12/2016 Abd Acute W/ Chest: IMPRESSION: 1. No active cardiopulmonary disease. 2. Multiple dilated loops of central abdominal small bowel with associated fluid levels. The findings are compatible with an acute diarrheal illness; however, a developing small bowel obstruction would be difficult to exclude.  08/13/2016 Abd 1 View: IMPRESSION: Plain film is negative for signs of bowel obstruction, with gas throughout the colon extending to the rectum.  Discharge Instructions: Discharge Instructions    Increase activity slowly    Complete by:  As directed       Signed: Reymundo Poll, MD 08/16/2016, 9:51 AM   Pager: [phone]

## 2016-08-13 NOTE — Progress Notes (Signed)
   Subjective: Abdominal pain is improved today. He reports less frequent bowel movements that are now more formed. He reports that he feels hungry and would like to attempt eating. Afebrile overnight.  Objective:  Vital signs in last 24 hours: Vitals:   08/12/16 1615 08/12/16 2110 08/13/16 0526 08/13/16 1000  BP: (!) 141/73 139/69 136/76 (!) 149/79  Pulse: 64 (!) 57 63 65  Resp: Temp: 98.2 F (36.8 C) 98.4 F (36.9 C) 98.6 F (37 C) 98.1 F (36.7 C)  TempSrc: Oral Oral Oral Oral  SpO2: 98% 96% 98% 99%  Weight: 177 lb 9.6 oz (80.6 kg)     Height:  (1.727 m)      Physical Exam Constitutional: NAD, appears comfortable HEENT: Atraumatic, normocephalic. PERRL, anicteric sclera.  Cardiovascular: Bradycardic, regular rhythm, no murmurs, rubs, or gallops.  Pulmonary/Chest: CTAB, no wheezes, rales, or rhonchi.  Abdominal: Soft, mildly tender to palpation in all quadrants, non distended. Hypoactive bowel sounds.  Extremities: Warm and well perfused. No edema.  Neurological: A&Ox3, CN II - XII grossly intact.   Assessment/Plan:  Mr. Uptain is a 56 yo M with PMHx of HTN who presents to the ED with complaint of abdominal pain, nausea and diarrhea.  Acute Gastroenteritis: Patient presented with 2 days of nausea, cramping abdominal pain, and diarrhea. On admission he was afebrile without leukocytosis. CT abdomen and pelvis showed dilated fluid and gas-filled small bowel loops concerning for early low-grade or developing SBO. However he has continued to have regular bowel movements and clinically does not appear to be obstructed. There is no evidence in his history, physical exam or clinical workup to suggest another etiology such as pancreatitis, gallbladder disease, liver disease, appendicitis or diverticulitis. C Diff negative. He was made nothing by mouth on admission for bowel rest and started on IV fluids. He was given antibiotics and as needed dilaudid. This morning he  reports his pain is improved. He is having less frequent bowel movements that are more formed. He reports that he is hungry and feels up to eating. He is remained afebrile overnight. Repeat abdominal x-ray today is negative for signs of SBO. -- Liquid diet; advance as tolerated  -- Fluid stopped -- Continue dilaudid 0.5 mg q4 prn for pain  -- Continue Zofran 4 mg q6 prn for nausea  -- Possible discharge later today patient is PO   HTN: Patient has a history of hypertension, previously controlled with lisinopril-hydrochlorothiazide. Patient later lost weight and was taken off of his antihypertensive, but was lost to follow-up. Patient has recently seen his PCP for monitoring of his hypertension. He is not currently on any medication. -Monitor BP  Bradycardia: Patient is bradycardic but regular rhythm on exam. He has asymptomatic. We will obtain a baseline EKG. -Telemetry -EKG  FEN: Stopped fluids, replete lytes prn, liquid diet, advance as tolerated  VTE ppx: Lovenox  Code Status: FULL    Dispo: Anticipated discharge in approximately 1-2 day(s).   Devon Poll, MD 08/13/2016, 1:30 PM Pager: [phone]

## 2016-09-11 ENCOUNTER — Encounter: Payer: Self-pay | Admitting: Gastroenterology

## 2016-09-11 ENCOUNTER — Ambulatory Visit (INDEPENDENT_AMBULATORY_CARE_PROVIDER_SITE_OTHER): Payer: BLUE CROSS/BLUE SHIELD | Admitting: Gastroenterology

## 2016-09-11 VITALS — BP 120/74 | HR 80 | Ht 68.0 in | Wt 178.0 lb

## 2016-09-11 DIAGNOSIS — R109 Unspecified abdominal pain: Secondary | ICD-10-CM

## 2016-09-11 NOTE — Patient Instructions (Addendum)
Please return to see Dr. Christella HartiganJacobs in 2-3 months, sooner if needed.  Continue to avoid NSAIDs.  We will get records sent from your previous gastroenterologist (from [facility] colonoscopy in the past year) for review.  This will include any endoscopic (colonoscopy or upper endoscopy) procedures and any associated pathology reports.

## 2016-09-11 NOTE — Progress Notes (Signed)
HPI: This is a very pleasant 56 year old man  who was referred to me by Roe Coombs*  to evaluate  abdominal pain .    Chief complaint is chronic lower abdominal pain   For 6 months, lower abd pains after eating. Feel an ache about after every meal (lasting about 20 min) and would imrpove with time.  Bowels have not really   He weight has been stable.  He used to take a lot of advil, average 3-6 pills per day.  He was doing this for 2-3 years.  He stopped taking NSAIDs completely 1 month ago.  Changed to tylenol only.  He does feel that since stopping NSAIDs he is overall a bit improved in terms of his lower abdominal discomforts.  Still has bloating intermittently.  He had a colonoscopy within the past year in [facility]; he was told it was normal and needs to repeat in 10 years.  He was hospitalized last month for a 2 night stay when he presented with abdominal pain nausea vomiting. Initially it sounds like they felt he might have small bowel obstruction but his discharge diagnosis suggest more of a gastroenteritis picture.  Old Data Reviewed:  Labs 08/2016: cbc, cmet, lipase, HIV all normal  CT scan 08/2016: Mildly prominent fluid and gas-filled small bowel loops in the mid and lower abdomen and right lower quadrant. There is no well-defined transition zone, but the distal ileum is decompressed. Cannot exclude early low grade small bowel obstruction. Trace free fluid in the abdomen and pelvis.  08/12/2016 Abd Acute W/ Chest: IMPRESSION: 1. No active cardiopulmonary disease. 2. Multiple dilated loops of central abdominal small bowel with associated fluid levels. The findings are compatible with an acute diarrheal illness; however, a developing small bowel obstruction would be difficult to exclude.  08/13/2016 Abd 1 View: IMPRESSION: Plain film is negative for signs of bowel obstruction, with gas throughout the colon extending to the rectum.   Review of  systems: Pertinent positive and negative review of systems were noted in the above HPI section. Complete review of systems was performed and was otherwise normal.   History reviewed. No pertinent past medical history.  Past Surgical History:  Procedure Laterality Date  . VASECTOMY      No current outpatient prescriptions on file.   No current facility-administered medications for this visit.     Allergies as of 09/11/2016  . (No Known Allergies)    Family History  Problem Relation Age of Onset  . Heart disease Mother   . Diabetes Mother   . Cancer Mother        unknown type  . Bladder Cancer Father   . Heart disease Father   . Colon cancer Neg Hx   . Stomach cancer Neg Hx   . Rectal cancer Neg Hx   . Liver cancer Neg Hx   . Esophageal cancer Neg Hx     Social History   Social History  . Marital status: Divorced    Spouse name: N/A  . Number of children: 2  . Years of education: N/A   Occupational History  . self-employed    Social History Main Topics  . Smoking status: Never Smoker  . Smokeless tobacco: Never Used  . Alcohol use Yes     Comment: occassional  . Drug use: No  . Sexual activity: Not on file   Other Topics Concern  . Not on file   Social History Narrative  . No narrative on file  Physical Exam: BP 120/74   Pulse 80   Ht 5\' 8"  (1.727 m)   Wt 178 lb (80.7 kg)   BMI 27.06 kg/m  Constitutional: generally well-appearing Psychiatric: alert and oriented x3 Eyes: extraocular movements intact Mouth: oral pharynx moist, no lesions Neck: supple no lymphadenopathy Cardiovascular: heart regular rate and rhythm Lungs: clear to auscultation bilaterally Abdomen: soft, nontender, nondistended, no obvious ascites, no peritoneal signs, normal bowel sounds Extremities: no lower extremity edema bilaterally Skin: no lesions on visible extremities   Assessment and plan: 56 y.o. male with  Chronic lower abdominal discomfort, postprandial  He  was taking 3-6 Advil daily for at least a couple years and I suspect that that was at least playing somewhat of a role in his GI distress. He has since stopped the NSAIDs about a month ago and has noticed overall he is a bit improved. He is not back to normal. I recommend we simply observe him clinically over the next couple months and if he continues to improve and feel better than I think known other testing is needed. If he is still bothered by postprandial abdominal discomforts now would likely recommend upper endoscopy., Further testing.  We will get records from his [facility] gastroenterologist who performed a colonoscopy for him within the past year. He believes this was normal. He also understands that that gastroenterologist has since retired and so I am happy to take over his colon cancer screening needs.    Please see the "Patient Instructions" section for addition details about the plan.   Rob Buntinganiel Zuleica Seith, MD [facility] 09/11/2016, 10:04 AM  [place]: [place], [place]*

## 2016-09-25 ENCOUNTER — Telehealth: Payer: Self-pay | Admitting: Gastroenterology

## 2016-09-25 NOTE — Telephone Encounter (Signed)
Colonoscopy Dr. Marcelene ButteHurrelbrink 01/2015; routine screening; examination was normal.  He needs recall colonoscopy 01/2025  Thanks

## 2016-09-25 NOTE — Telephone Encounter (Signed)
Pt has notified and recall in EPIC

## 2016-10-05 ENCOUNTER — Telehealth: Payer: Self-pay | Admitting: Gastroenterology

## 2016-10-05 NOTE — Telephone Encounter (Signed)
ROI fax to [facility]

## 2016-10-25 ENCOUNTER — Encounter (HOSPITAL_COMMUNITY): Payer: Self-pay

## 2016-10-25 DIAGNOSIS — Y929 Unspecified place or not applicable: Secondary | ICD-10-CM | POA: Diagnosis not present

## 2016-10-25 DIAGNOSIS — I10 Essential (primary) hypertension: Secondary | ICD-10-CM | POA: Insufficient documentation

## 2016-10-25 DIAGNOSIS — Y999 Unspecified external cause status: Secondary | ICD-10-CM | POA: Insufficient documentation

## 2016-10-25 DIAGNOSIS — X58XXXA Exposure to other specified factors, initial encounter: Secondary | ICD-10-CM | POA: Insufficient documentation

## 2016-10-25 DIAGNOSIS — Y939 Activity, unspecified: Secondary | ICD-10-CM | POA: Diagnosis not present

## 2016-10-25 DIAGNOSIS — S0502XA Injury of conjunctiva and corneal abrasion without foreign body, left eye, initial encounter: Secondary | ICD-10-CM | POA: Diagnosis not present

## 2016-10-25 DIAGNOSIS — Z79899 Other long term (current) drug therapy: Secondary | ICD-10-CM | POA: Diagnosis not present

## 2016-10-25 DIAGNOSIS — S0592XA Unspecified injury of left eye and orbit, initial encounter: Secondary | ICD-10-CM | POA: Diagnosis present

## 2016-10-25 NOTE — ED Triage Notes (Signed)
Pt states believes has saw dust in L eye. Pt complaining of some eye discomfort. Pt denies any blurred vision or double vision.

## 2016-10-26 ENCOUNTER — Emergency Department (HOSPITAL_COMMUNITY)
Admission: EM | Admit: 2016-10-26 | Discharge: 2016-10-26 | Disposition: A | Discharge status: Home / Self Care | Payer: BLUE CROSS/BLUE SHIELD | Attending: Emergency Medicine | Admitting: Emergency Medicine

## 2016-10-26 DIAGNOSIS — S0502XA Injury of conjunctiva and corneal abrasion without foreign body, left eye, initial encounter: Secondary | ICD-10-CM

## 2016-10-26 MED ORDER — FLUORESCEIN SODIUM 0.6 MG OP STRP
1.0000 | ORAL_STRIP | Freq: Once | OPHTHALMIC | Status: AC
  Administered 2016-10-26: 1 via OPHTHALMIC
  Filled 2016-10-26: qty 1

## 2016-10-26 MED ORDER — OXYCODONE-ACETAMINOPHEN 5-325 MG PO TABS
1.0000 | ORAL_TABLET | Freq: Once | ORAL | Status: AC
  Administered 2016-10-26: 1 via ORAL
  Filled 2016-10-26: qty 1

## 2016-10-26 MED ORDER — ERYTHROMYCIN 5 MG/GM OP OINT: TOPICAL_OINTMENT | OPHTHALMIC | 0 refills | Status: DC

## 2016-10-26 MED ORDER — TETRACAINE HCL 0.5 % OP SOLN
2.0000 [drp] | Freq: Once | OPHTHALMIC | Status: AC
  Administered 2016-10-26: 2 [drp] via OPHTHALMIC
  Filled 2016-10-26: qty 4

## 2016-10-26 MED ORDER — OXYCODONE-ACETAMINOPHEN 5-325 MG PO TABS: 1.0000 | ORAL_TABLET | Freq: Four times a day (QID) | ORAL | 0 refills | Status: DC | PRN

## 2016-10-26 NOTE — ED Provider Notes (Signed)
[facility]-EMERGENCY DEPT Provider Note   CSN: [id number] Arrival date & time: 10/25/16  2304  By signing my name below, I, Deland Pretty, attest that this documentation has been prepared under the direction and in the presence of Emmilyn Crooke, Mayer Masker, MD. Electronically Signed: Deland Pretty, ED Scribe. 10/26/16. 1:51 AM.  History   Chief Complaint Chief Complaint  Patient presents with  . Eye Pain   The history is provided by the patient. No language interpreter was used.   HPI Comments: Devon Riley is a 56 y.o. male who presents to the Emergency Department complaining of 8/9 left eye pain that occurred PTA yesterday. He states that he believes that he acquired dust in his eye. Pressure to the area helps relieve his pain. He also rinsed his eye with inadequate relief. The pt states that he does not wear contacts or glasses. The pt denies blurred vision and other visual disturbances. Tetanus is UTD.   History reviewed. No pertinent past medical history.  Patient Active Problem List   Diagnosis Date Noted  . HTN (hypertension) 08/12/2016  . Bradycardia 08/12/2016  . Gastroenteritis 08/12/2016    Past Surgical History:  Procedure Laterality Date  . VASECTOMY         Home Medications    Prior to Admission medications   Medication Sig Start Date End Date Taking? Authorizing Provider  erythromycin ophthalmic ointment Place a 1/2 inch ribbon of ointment into the lower eyelid TID PRN 10/26/16   Raeonna Milo, Mayer Masker, MD  oxyCODONE-acetaminophen (PERCOCET/ROXICET) 5-325 MG tablet Take 1 tablet by mouth every 6 (six) hours as needed for severe pain. 10/26/16   Blong Busk, Mayer Masker, MD    Family History Family History  Problem Relation Age of Onset  . Heart disease Mother   . Diabetes Mother   . Cancer Mother        unknown type  . Bladder Cancer Father   . Heart disease Father   . Colon cancer Neg Hx   . Stomach cancer Neg Hx   . Rectal cancer Neg Hx   . Liver cancer Neg Hx    . Esophageal cancer Neg Hx     Social History Social History  Substance Use Topics  . Smoking status: Never Smoker  . Smokeless tobacco: Never Used  . Alcohol use Yes     Comment: occassional     Allergies   Patient has no known allergies.   Review of Systems Review of Systems  Eyes: Positive for photophobia and pain. Negative for visual disturbance.  All other systems reviewed and are negative.    Physical Exam Updated Vital Signs BP 138/67   Pulse 63   Temp 98.2 F (36.8 C) (Oral)   Resp 16   SpO2 100%   Physical Exam  Constitutional: He is oriented to person, place, and time. He appears well-developed and well-nourished.  HENT:  Head: Normocephalic and atraumatic.  Eyes: EOM are normal. Pupils are equal, round, and reactive to light. Lids are everted and swept, no foreign bodies found. Left conjunctiva is injected.    Mild injection of the left eye, there is fluoroscein uptake at 3:00, no foreign bodies noted with eyelid inversion  Cardiovascular: Normal rate and regular rhythm.   Pulmonary/Chest: Effort normal. No respiratory distress.  Neurological: He is alert and oriented to person, place, and time.  Skin: Skin is warm and dry.  Psychiatric: He has a normal mood and affect.  Nursing note and vitals reviewed.    ED  Treatments / Results   DIAGNOSTIC STUDIES: Oxygen Saturation is 99% on RA, normal by my interpretation.   COORDINATION OF CARE: 1:48 AM-Discussed next steps with pt. Pt verbalized understanding and is agreeable with the plan.   Labs (all labs ordered are listed, but only abnormal results are displayed) Labs Reviewed - No data to display  EKG  EKG Interpretation None       Radiology No results found.  Procedures Procedures (including critical care time)  Medications Ordered in ED Medications  fluorescein ophthalmic strip 1 strip (1 strip Both Eyes Given 10/26/16 0139)  tetracaine (PONTOCAINE) 0.5 % ophthalmic solution 2  drop (2 drops Both Eyes Given 10/26/16 0139)  oxyCODONE-acetaminophen (PERCOCET/ROXICET) 5-325 MG per tablet 1 tablet (1 tablet Oral Given 10/26/16 0215)     Initial Impression / Assessment and Plan / ED Course  I have reviewed the triage vital signs and the nursing notes.  Pertinent labs & imaging results that were available during my care of the patient were reviewed by me and considered in my medical decision making (see chart for details).     Patient presents with foreign body sensation in the left eye. Reports getting dust in his eye. Denies any vision changes. He has uptake at 3:00 suggestive of corneal abrasion. No persistent foreign bodies. Tetanus is updated. Recommend erythromycin ointment and pain medication. Advised that this would likely improve in the next 24-48 hours.  After history, exam, and medical workup I feel the patient has been appropriately medically screened and is safe for discharge home. Pertinent diagnoses were discussed with the patient. Patient was given return precautions.   Final Clinical Impressions(s) / ED Diagnoses   Final diagnoses:  Abrasion of left cornea, initial encounter    New Prescriptions Discharge Medication List as of 10/26/2016  2:06 AM    START taking these medications   Details  erythromycin ophthalmic ointment Place a 1/2 inch ribbon of ointment into the lower eyelid TID PRN, Print    oxyCODONE-acetaminophen (PERCOCET/ROXICET) 5-325 MG tablet Take 1 tablet by mouth every 6 (six) hours as needed for severe pain., Starting Mon 10/26/2016, Print       I personally performed the services described in this documentation, which was scribed in my presence. The recorded information has been reviewed and is accurate.     Shon BatonHorton, Raynell Scott F, MD 10/26/16 [phone]

## 2016-10-26 NOTE — ED Notes (Signed)
MD at bedside. 

## 2017-01-07 ENCOUNTER — Encounter (INDEPENDENT_AMBULATORY_CARE_PROVIDER_SITE_OTHER): Payer: Self-pay

## 2017-01-07 ENCOUNTER — Ambulatory Visit (INDEPENDENT_AMBULATORY_CARE_PROVIDER_SITE_OTHER): Payer: BLUE CROSS/BLUE SHIELD | Admitting: Internal Medicine

## 2017-01-07 ENCOUNTER — Encounter: Payer: Self-pay | Admitting: Internal Medicine

## 2017-01-07 VITALS — BP 133/80 | HR 78 | Ht 67.0 in | Wt 184.4 lb

## 2017-01-07 DIAGNOSIS — R9431 Abnormal electrocardiogram [ECG] [EKG]: Secondary | ICD-10-CM

## 2017-01-07 DIAGNOSIS — R072 Precordial pain: Secondary | ICD-10-CM | POA: Diagnosis not present

## 2017-01-07 DIAGNOSIS — R079 Chest pain, unspecified: Secondary | ICD-10-CM | POA: Insufficient documentation

## 2017-01-07 DIAGNOSIS — R011 Cardiac murmur, unspecified: Secondary | ICD-10-CM | POA: Diagnosis not present

## 2017-01-07 NOTE — Patient Instructions (Signed)
Medication Instructions: Dr Hilty recommends that you continue on your current medications as directed. Please refer to the Current Medication list given to you today.  Labwork: NONE ORDERED  Testing/Procedures: 1. Exercise Myoview Stress Test - Your physician has requested that you have en exercise stress myoview. For further information please visit [email or web address]. Please follow instruction sheet, as given.  2. Echocardiogram - Your physician has requested that you have an echocardiogram. Echocardiography is a painless test that uses sound waves to create images of your heart. It provides your doctor with information about the size and shape of your heart and how well your heart's chambers and valves are working. This procedure takes approximately one hour. There are no restrictions for this procedure.  **These have been ordered to be performed at our [address] location - [address], [address]  Follow-up: Dr Hilty recommends that you schedule a follow-up appointment after tests are completed.  If you need a refill on your cardiac medications before your next appointment, please call your pharmacy. 

## 2017-01-07 NOTE — Progress Notes (Signed)
OFFICE CONSULT NOTE  Chief Complaint:  Chest pain  Primary Care Physician: [place], [facility]  HPI:  Devon Riley is a 56 y.o. male who is being seen today for the evaluation of chest pain at the request of Silvestre GunnerSummerfield, [place]*. Devon Riley is a pleasant 56 year old gentleman who recently had left-sided chest pain. [facility] and woke up with pain in the left anterior chest. He says it radiated from the left side of his chest to the right and somewhat down the left arm. It lasted only for a few hours and then started to get better. At first he thought was a pulled muscle. Subsequently he has not had any further left chest pain symptoms however he has short of breath at times and gets fatigued. He attributed some of that to aging and working out in the hot weather. He is an Engineer, siteHVAC technician. He talked to one of his brothers who has a history of coronary artery disease and recently had a coronary stent at age 58. He also had 2 brothers who died of lung cancer/lung disease, both of which had heart disease and both of his parents have heart disease as well. He presented to his primary care provider who performed an EKG which was abnormal and he was referred to us. That EKG showed significant voltage criteria for LVH and anterolateral and inferior T-wave inversions which are deep.  PMHx:  No past medical history on file.  He reports no significant medical problems  Past Surgical History:  Procedure Laterality Date  . VASECTOMY      FAMHx:  Family History  Problem Relation Age of Onset  . Heart disease Mother   . Diabetes Mother   . Cancer Mother        unknown type  . Bladder Cancer Father   . Heart disease Father   . Colon cancer Neg Hx   . Stomach cancer Neg Hx   . Rectal cancer Neg Hx   . Liver cancer Neg Hx   . Esophageal cancer Neg Hx     SOCHx:   reports that he has never smoked. He has never used  smokeless tobacco. He reports that he drinks alcohol. He reports that he does not use drugs.  ALLERGIES:  No Known Allergies  ROS: Pertinent items noted in HPI and remainder of comprehensive ROS otherwise negative.  HOME MEDS: No current outpatient prescriptions on file prior to visit.   No current facility-administered medications on file prior to visit.     LABS/IMAGING: No results found for this or any previous visit (from the past 48 hour(s)). No results found.  LIPID PANEL: No results found for: CHOL, TRIG, HDL, CHOLHDL, VLDL, LDLCALC, LDLDIRECT  WEIGHTS: Wt Readings from Last 3 Encounters:  01/07/17 184 lb 6.4 oz (83.6 kg)  09/11/16 178 lb (80.7 kg)  08/12/16 177 lb 9.6 oz (80.6 kg)    VITALS: BP 133/80 (BP Location: Left Arm, Cuff Size: Normal)   Pulse 78   Ht 5\' 7"  (1.702 m)   Wt 184 lb 6.4 oz (83.6 kg)   BMI 28.88 kg/m   EXAM: General appearance: alert and no distress Neck: no carotid bruit, no JVD and thyroid not enlarged, symmetric, no tenderness/mass/nodules Lungs: clear to auscultation bilaterally Heart: regular rate and rhythm, S1, S2 normal and systolic murmur: early systolic 2/6, crescendo at 2nd right intercostal space Abdomen: soft, non-tender; bowel sounds normal; no masses,  no organomegaly Extremities: extremities normal, atraumatic, no cyanosis or edema Pulses: 2+ and symmetric Skin: Skin color, texture, turgor normal. No rashes or lesions Neurologic: Grossly normal Psych: Pleasant  EKG: Normal sinus rhythm at 66, LVH with repolarization abnormalities and deep inferior and anterolateral T-wave inversions - personally reviewed  ASSESSMENT: 1. Chest pain-possibly unstable angina 2. Markedly abnormal EKG with LVH and T-wave inversions 3. Murmur 4. Significant family history of premature coronary artery disease  PLAN: 1.   Mr. Hirano had chest pain which sounds somewhat atypical however did awaken him from sleep and was left-sided. He has an  abnormal EKG with deep T-wave inversions and voltage criteria for LVH however he has no history of hypertension. These findings could suggest an underlying hypertrophic cardiomyopathy. This also could be seen with significant ischemic coronary disease. Since he has such a significant family history of heart disease, I would recommend an exercise Myoview stress test. Plain exercise treadmill testing could not be interpreted given the baseline EKG abnormalities. In addition will obtain an echocardiogram as he has a systolic ejection murmur, which may be an outflow tract murmur and again could also suggest hypertrophic cardiomyopathy.  Thanks for the kind referral. Follow-up with me afterwards.  Chrystie Nose, MD, [facility]  [place]  [facility]  Attending Cardiologist  Direct Dial: [phone]  Fax: [phone]  Website:  [email or web address] Oleta Gunnoe 01/07/2017, 4:59 PM

## 2017-01-25 ENCOUNTER — Telehealth (HOSPITAL_COMMUNITY): Payer: Self-pay | Admitting: *Deleted

## 2017-01-25 NOTE — Telephone Encounter (Signed)
Patient given detailed instructions per Myocardial Perfusion Study Information Sheet for the test on 01/27/17. Patient notified to arrive 15 minutes early and that it is imperative to arrive on time for appointment to keep from having the test rescheduled.  If you need to cancel or reschedule your appointment, please call the office within 24 hours of your appointment. . Patient verbalized understanding. Irma Roulhac Jacqueline    

## 2017-01-27 ENCOUNTER — Ambulatory Visit (HOSPITAL_COMMUNITY): Payer: BLUE CROSS/BLUE SHIELD

## 2017-01-27 ENCOUNTER — Other Ambulatory Visit: Payer: Self-pay

## 2017-01-27 ENCOUNTER — Ambulatory Visit (HOSPITAL_COMMUNITY): Payer: BLUE CROSS/BLUE SHIELD | Attending: Internal Medicine

## 2017-01-27 ENCOUNTER — Ambulatory Visit (HOSPITAL_BASED_OUTPATIENT_CLINIC_OR_DEPARTMENT_OTHER): Payer: BLUE CROSS/BLUE SHIELD

## 2017-01-27 DIAGNOSIS — R9431 Abnormal electrocardiogram [ECG] [EKG]: Secondary | ICD-10-CM

## 2017-01-27 DIAGNOSIS — R072 Precordial pain: Secondary | ICD-10-CM | POA: Diagnosis present

## 2017-01-27 DIAGNOSIS — Z8249 Family history of ischemic heart disease and other diseases of the circulatory system: Secondary | ICD-10-CM | POA: Insufficient documentation

## 2017-01-27 LAB — MYOCARDIAL PERFUSION IMAGING
Estimated workload: 10.1 METS
Exercise duration (min): 9 min
Exercise duration (sec): 30 s
LV dias vol: 109 mL (ref 62–150)
LV sys vol: 40 mL
MPHR: 162 {beats}/min
Peak HR: 144 {beats}/min
Percent HR: 88 %
RATE: 0.34
Rest HR: 69 {beats}/min
SDS: 1
SRS: 3
SSS: 1
TID: 0.96

## 2017-01-27 LAB — ECHOCARDIOGRAM COMPLETE
Height: 67 in
Weight: 2944 [oz_av]

## 2017-01-27 MED ORDER — TECHNETIUM TC 99M TETROFOSMIN IV KIT
30.7000 | PACK | Freq: Once | INTRAVENOUS | Status: AC | PRN
  Administered 2017-01-27: 30.7 via INTRAVENOUS
  Filled 2017-01-27: qty 31

## 2017-01-27 MED ORDER — TECHNETIUM TC 99M TETROFOSMIN IV KIT
10.2000 | PACK | Freq: Once | INTRAVENOUS | Status: AC | PRN
  Administered 2017-01-27: 10.2 via INTRAVENOUS
  Filled 2017-01-27: qty 11

## 2017-02-11 ENCOUNTER — Encounter: Payer: Self-pay | Admitting: Internal Medicine

## 2017-02-11 ENCOUNTER — Ambulatory Visit (INDEPENDENT_AMBULATORY_CARE_PROVIDER_SITE_OTHER): Payer: BLUE CROSS/BLUE SHIELD | Admitting: Internal Medicine

## 2017-02-11 VITALS — BP 124/72 | HR 74 | Ht 67.0 in | Wt 185.0 lb

## 2017-02-11 DIAGNOSIS — R011 Cardiac murmur, unspecified: Secondary | ICD-10-CM

## 2017-02-11 DIAGNOSIS — R079 Chest pain, unspecified: Secondary | ICD-10-CM

## 2017-02-11 DIAGNOSIS — R9431 Abnormal electrocardiogram [ECG] [EKG]: Secondary | ICD-10-CM | POA: Diagnosis not present

## 2017-02-11 NOTE — Progress Notes (Signed)
OFFICE CONSULT NOTE  Chief Complaint:  Follow-up studies  [facility] Physician: [facility]  HPI:  Devon Riley is a 56 y.o. male who is being seen today for the evaluation of chest pain at the request of Devon Riley, [place]*. Devon Riley is a pleasant 56 year old gentleman who recently had left-sided chest pain. He said he was at "the [facility]" this past Labor Day weekend and woke up with pain in the left anterior chest. He says it radiated from the left side of his chest to the right and somewhat down the left arm. It lasted only for a few hours and then started to get better. At first he thought was a pulled muscle. Subsequently he has not had any further left chest pain symptoms however he has short of breath at times and gets fatigued. He attributed some of that to aging and working out in the hot weather. 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 He also had 2 brothers who died of lung cancer/lung disease, both of which had heart disease and both of his parents have heart disease as well. He presented to his primary care provider who performed an EKG which was abnormal and he was referred to [place]. That EKG showed significant voltage criteria for LVH and anterolateral and inferior T-wave inversions which are deep.  02/11/2017  Devon Riley returns today for follow-up. He was noted to have markedly abnormal EKG findings with deep T-wave inversions in the anterolateral leads. He underwent a gated myocardial perfusion study with exercise which showed normal LV function with EF 63% and no ischemia. There was a hypertensive response to exercise with marked inferior and lateral T-wave inversions. Echocardiogram showed normal systolic function, mild diastolic dysfunction and normal LV wall thickness. There is no evidence for hypertrophic cardiomyopathy or apical  hypertrophic cardiomyopathy by echo. He reports his symptoms have improved. I repeated his EKG today which shows persistent anterolateral T-wave inversions. I suspect that either this is repolarization abnormality or perhaps apical hypertrophic cardiopathy. We discussed that this may alter his care if he does have a cardiomyopathy in that he may benefit from the addition of an ACE inhibitor, ARB or other medications. Is not clear that he needs to continue to take aspirin.  PMHx:  History reviewed. No pertinent past medical history.  He reports no significant medical problems  Past Surgical History:  Procedure Laterality Date  . VASECTOMY      FAMHx:  Family History  Problem Relation Age of Onset  . Heart disease Mother   . Diabetes Mother   . Cancer Mother        unknown type  . Bladder Cancer Father   . Heart disease Father   . Colon cancer Neg Hx   . Stomach cancer Neg Hx   . Rectal cancer Neg Hx   . Liver cancer Neg Hx   . Esophageal cancer Neg Hx     SOCHx:   reports that he has never smoked. He has never used smokeless tobacco. He reports that he drinks alcohol. He reports that he does not use drugs.  ALLERGIES:  No Known Allergies  ROS: Pertinent items noted in HPI and remainder of comprehensive ROS otherwise negative.  HOME MEDS: Current Outpatient Prescriptions on File Prior to Visit  Medication Sig Dispense Refill  . aspirin EC 81 MG tablet Take  81 mg by mouth daily.     No current facility-administered medications on file prior to visit.     LABS/IMAGING: No results found for this or any previous visit (from the past 48 hour(s)). No results found.  LIPID PANEL: No results found for: CHOL, TRIG, HDL, CHOLHDL, VLDL, LDLCALC, LDLDIRECT  WEIGHTS: Wt Readings from Last 3 Encounters:  02/11/17 185 lb (83.9 kg)  01/27/17 184 lb (83.5 kg)  01/07/17 184 lb 6.4 oz (83.6 kg)    VITALS: BP 124/72   Pulse 74   Ht  (1.702 m)   Wt 185 lb (83.9 kg)   BMI  28.98 kg/m   EXAM: General appearance: alert and no distress Neck: no carotid bruit, no JVD and thyroid not enlarged, symmetric, no tenderness/mass/nodules Lungs: clear to auscultation bilaterally Heart: regular rate and rhythm, S1, S2 normal and systolic murmur: early systolic 2/6, crescendo at 2nd right intercostal space Abdomen: soft, non-tender; bowel sounds normal; no masses,  no organomegaly Extremities: extremities normal, atraumatic, no cyanosis or edema Pulses: 2+ and symmetric Skin: Skin color, texture, turgor normal. No rashes or lesions Neurologic: Grossly normal Psych: Pleasant  EKG: Sinus bradycardia 59, LVH with repolarization abnormality, deep T-wave inversions in V3 through V6- personally reviewed  ASSESSMENT: 1. Chest pain-low risk myoview (01/2017) 2. Normal LVEF 60-65% with normal wall thickness and mild diastolic dysfunction (01/2017) 3. Markedly abnormal EKG with LVH and T-wave inversions 4. Murmur 5. Significant family history of premature coronary artery disease  PLAN: 1.   Devon Riley continues to have abnormal EKG changes but is no longer have any chest pain. A low risk Myoview and normal echo. No clearly obvious reasons for his EKG abnormality on echo. I'm concerned about possible apical hypertrophic cardiopathy is not identified by echo. I would recommend a cardiac MRI to further evaluate cardiac structure. If this is indeed normal then I would suspect this most must be a repolarization abnormality, the cause of which is unclear.  Follow-up in 1 month.  Chrystie Nose, MD, [facility]  [place]  [facility]  Attending Cardiologist  Direct Dial: [phone]  Fax: [phone]  Website:  [email or web address] Nicely Duan Scharnhorst 02/11/2017, 9:17 AM

## 2017-02-11 NOTE — Patient Instructions (Signed)
Dr. Rennis Golden has ordered a CARDIAC MRI done at [facility]  Your physician recommends that you schedule a follow-up appointment in: ONE MONTH with Dr. Rennis Golden (after test)

## 2017-02-17 ENCOUNTER — Telehealth: Payer: Self-pay | Admitting: Internal Medicine

## 2017-02-17 ENCOUNTER — Encounter: Payer: Self-pay | Admitting: Internal Medicine

## 2017-02-17 NOTE — Telephone Encounter (Signed)
Called the patient and left a VM with his date, time and location of his cardiac MRI.

## 2017-02-23 ENCOUNTER — Ambulatory Visit (HOSPITAL_COMMUNITY)
Admission: RE | Admit: 2017-02-23 | Discharge: 2017-02-23 | Disposition: A | Discharge status: Home / Self Care | Payer: BLUE CROSS/BLUE SHIELD | Source: Ambulatory Visit | Attending: Internal Medicine | Admitting: Internal Medicine

## 2017-02-23 DIAGNOSIS — R079 Chest pain, unspecified: Secondary | ICD-10-CM | POA: Diagnosis not present

## 2017-02-23 DIAGNOSIS — Q2572 Congenital pulmonary arteriovenous malformation: Secondary | ICD-10-CM | POA: Insufficient documentation

## 2017-02-23 DIAGNOSIS — I517 Cardiomegaly: Secondary | ICD-10-CM | POA: Diagnosis not present

## 2017-02-23 DIAGNOSIS — I422 Other hypertrophic cardiomyopathy: Secondary | ICD-10-CM | POA: Diagnosis not present

## 2017-02-23 LAB — CREATININE, SERUM: Creatinine, Ser: 1.21 mg/dL (ref 0.61–1.24)

## 2017-02-23 MED ORDER — GADOBENATE DIMEGLUMINE 529 MG/ML IV SOLN
28.0000 mL | Freq: Once | INTRAVENOUS | Status: AC | PRN
  Administered 2017-02-23: 28 mL via INTRAVENOUS

## 2017-03-03 ENCOUNTER — Telehealth: Payer: Self-pay | Admitting: Internal Medicine

## 2017-03-03 NOTE — Telephone Encounter (Signed)
Pt said he was told to call back for his test results,but a name was not given.

## 2017-03-03 NOTE — Telephone Encounter (Signed)
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  Left detailed message (DPR)

## 2017-03-29 ENCOUNTER — Ambulatory Visit: Payer: BLUE CROSS/BLUE SHIELD | Admitting: Internal Medicine

## 2017-03-29 NOTE — Telephone Encounter (Signed)
No [facility] from [facility]

## 2017-04-16 ENCOUNTER — Encounter: Payer: Self-pay | Admitting: Internal Medicine

## 2017-04-16 ENCOUNTER — Ambulatory Visit (INDEPENDENT_AMBULATORY_CARE_PROVIDER_SITE_OTHER): Payer: BLUE CROSS/BLUE SHIELD | Admitting: Internal Medicine

## 2017-04-16 VITALS — BP 130/80 | HR 64 | Ht 67.0 in | Wt 188.0 lb

## 2017-04-16 DIAGNOSIS — R079 Chest pain, unspecified: Secondary | ICD-10-CM | POA: Diagnosis not present

## 2017-04-16 DIAGNOSIS — R011 Cardiac murmur, unspecified: Secondary | ICD-10-CM

## 2017-04-16 DIAGNOSIS — R9431 Abnormal electrocardiogram [ECG] [EKG]: Secondary | ICD-10-CM

## 2017-04-16 DIAGNOSIS — I422 Other hypertrophic cardiomyopathy: Secondary | ICD-10-CM

## 2017-04-16 NOTE — Progress Notes (Signed)
OFFICE CONSULT NOTE  Chief Complaint:  Follow-up cMRI  Primary Care Physician: [place] Rocker[place], [facility]  HPI:  Devon Riley is a 56 y.o. male who is being seen today for the evaluation of chest pain at the request of Silvestre GunnerSummerfield, [place]*. Devon Riley is a pleasant 56 year old gentleman who recently had left-sided chest pain. [facility] [place] [facility] and woke up with pain in the left anterior chest. He says it radiated from the left side of his chest to the right and somewhat down the left arm. It lasted only for a few hours and then started to get better. At first he thought was a pulled muscle. Subsequently he has not had any further left chest pain symptoms however he has short of breath at times and gets fatigued. He attributed some of that to aging and working out in the hot weather. He is an Engineer, siteHVAC technician. He talked to one of his brothers who has a history of coronary artery disease and recently had a coronary stent at age 58. He also had 2 brothers who died of lung cancer/lung disease, both of which had heart disease and both of his parents have heart disease as well. He presented to his primary care provider who performed an EKG which was abnormal and he was referred to us. That EKG showed significant voltage criteria for LVH and anterolateral and inferior T-wave inversions which are deep.  02/11/2017  Devon Riley returns today for follow-up. He was noted to have markedly abnormal EKG findings with deep T-wave inversions in the anterolateral leads. He underwent a gated myocardial perfusion study with exercise which showed normal LV function with EF 63% and no ischemia. There was a hypertensive response to exercise with marked inferior and lateral T-wave inversions. Echocardiogram showed normal systolic function, mild diastolic dysfunction and normal LV wall thickness. There is no evidence for hypertrophic cardiomyopathy or apical  hypertrophic cardiomyopathy by echo. He reports his symptoms have improved. I repeated his EKG today which shows persistent anterolateral T-wave inversions. I suspect that either this is repolarization abnormality or perhaps apical hypertrophic cardiopathy. We discussed that this may alter his care if he does have a cardiomyopathy in that he may benefit from the addition of an ACE inhibitor, ARB or other medications. Is not clear that he needs to continue to take aspirin.  04/16/2017  Devon Riley was seen today in follow-up.  He underwent cardiac MRI for evaluation of deep T wave inversions anterolaterally.  His nuclear stress test was negative.  He has had no further chest pain.  His cardiac MRI findings collectively suggested apical variant hypertrophic cardiomyopathy.  There was abnormal delayed enhancement of the apical septal and apical areas of the left ventricle.  LVEF was 67%.  I explained the findings to Devon Riley today and reassured him that this is a low risk cardiomyopathy and I suspect that although he may have symptoms related to his apical hypertrophy, that this will not likely cause him significant issues in the future.  PMHx:  No past medical history on file.  He reports no significant medical problems  Past Surgical History:  Procedure Laterality Date  . VASECTOMY      FAMHx:  Family History  Problem Relation Age of Onset  . Heart disease Mother   . Diabetes Mother   . Cancer Mother        unknown type  . Bladder Cancer Father   . Heart  disease Father   . Colon cancer Neg Hx   . Stomach cancer Neg Hx   . Rectal cancer Neg Hx   . Liver cancer Neg Hx   . Esophageal cancer Neg Hx     SOCHx:   reports that  has never smoked. he has never used smokeless tobacco. He reports that he drinks alcohol. He reports that he does not use drugs.  ALLERGIES:  No Known Allergies  ROS: Pertinent items noted in HPI and remainder of comprehensive ROS otherwise negative.  HOME  MEDS: Current Outpatient Medications on File Prior to Visit  Medication Sig Dispense Refill  . aspirin EC 81 MG tablet Take 81 mg by mouth daily.     No current facility-administered medications on file prior to visit.     LABS/IMAGING: No results found for this or any previous visit (from the past 48 hour(s)). No results found.  LIPID PANEL: No results found for: CHOL, TRIG, HDL, CHOLHDL, VLDL, LDLCALC, LDLDIRECT  WEIGHTS: Wt Readings from Last 3 Encounters:  04/16/17 188 lb (85.3 kg)  02/11/17 185 lb (83.9 kg)  01/27/17 184 lb (83.5 kg)    VITALS: BP 130/80   Pulse 64   Ht 5\' 7"  (1.702 m)   Wt 188 lb (85.3 kg)   BMI 29.44 kg/m   EXAM: Deferred  EKG: Deferred  ASSESSMENT: 1. Apical variant hypertrophic cardiomyopathy 2. Chest pain-low risk myoview (01/2017) 3. Normal LVEF 60-65% with normal wall thickness and mild diastolic dysfunction (01/2017) 4. Markedly abnormal EKG with LVH and T-wave inversions 5. Murmur 6. Significant family history of premature coronary artery disease  PLAN: 1.   Devon Riley had a low risk MRI demonstrating apical hypertrophic cardiomyopathy with abnormal late gadolinium enhancement of the apex and anteroseptal walls.  LVEF was 67%.  This explains his EKG changes and I am satisfied that there is no obstructive coronary disease.  He is now asymptomatic without recurrent chest pain.  He does have a mild amount of TR and trivial MR with mild left atrial enlargement.  At this point no further cardiac workup is necessary.  He should continue to work on risk factor modification.  Happy to see him back on an as-needed basis.  Chrystie NoseKenneth C. Jerimey Burridge, MD, [facility] HospitalFACC  [place]  [facility]  Attending Cardiologist  Direct Dial: [phone]  Fax: [phone]  Website:  [email or web address]  Deron Poole C Felicidad Sugarman 04/16/2017, 8:50 AM

## 2017-04-16 NOTE — Patient Instructions (Signed)
Your physician recommends that you schedule a follow-up appointment with Dr. Hilty as needed.     

## 2017-08-01 ENCOUNTER — Encounter (HOSPITAL_COMMUNITY): Payer: Self-pay | Admitting: Family Medicine

## 2017-08-01 ENCOUNTER — Ambulatory Visit (HOSPITAL_COMMUNITY)
Admission: EM | Admit: 2017-08-01 | Discharge: 2017-08-01 | Disposition: A | Discharge status: Home / Self Care | Payer: BLUE CROSS/BLUE SHIELD | Attending: Urgent Care | Admitting: Urgent Care

## 2017-08-01 DIAGNOSIS — R5383 Other fatigue: Secondary | ICD-10-CM

## 2017-08-01 DIAGNOSIS — R52 Pain, unspecified: Secondary | ICD-10-CM

## 2017-08-01 DIAGNOSIS — J22 Unspecified acute lower respiratory infection: Secondary | ICD-10-CM

## 2017-08-01 DIAGNOSIS — R059 Cough, unspecified: Secondary | ICD-10-CM

## 2017-08-01 DIAGNOSIS — R519 Headache, unspecified: Secondary | ICD-10-CM

## 2017-08-01 DIAGNOSIS — R05 Cough: Secondary | ICD-10-CM

## 2017-08-01 DIAGNOSIS — R51 Headache: Secondary | ICD-10-CM

## 2017-08-01 DIAGNOSIS — R69 Illness, unspecified: Secondary | ICD-10-CM

## 2017-08-01 DIAGNOSIS — R5381 Other malaise: Secondary | ICD-10-CM

## 2017-08-01 DIAGNOSIS — J111 Influenza due to unidentified influenza virus with other respiratory manifestations: Secondary | ICD-10-CM

## 2017-08-01 MED ORDER — AZITHROMYCIN 250 MG PO TABS: ORAL_TABLET | ORAL | 0 refills | Status: DC

## 2017-08-01 MED ORDER — HYDROCOD POLST-CPM POLST ER 10-8 MG/5ML PO SUER: 5.0000 mL | Freq: Every evening | ORAL | 0 refills | Status: DC | PRN

## 2017-08-01 MED ORDER — BENZONATATE 100 MG PO CAPS: 100.0000 mg | ORAL_CAPSULE | Freq: Three times a day (TID) | ORAL | 0 refills | Status: DC | PRN

## 2017-08-01 NOTE — ED Provider Notes (Signed)
  MRN: [id number] DOB: 02/06/1961  Subjective:   Devon Riley is a 57 y.o. male presenting for 1 week history of worsening productive cough, coughing fits, mild wheezing, fatigue. Has also had subjective fever, body aches, malaise, headaches, throat pain, congestion, dizziness when he gets up quickly. Did not get his flu shot this season. Has tried otc cough syrup, DayQuil with minimal history. Has a remote history of smoking for ~1 year. Denies confusion, ear pain, sinus pain, chest pain, shob, n/v, abdominal pain, dysuria, weakness, numbness or tingling, rashes. His grandchild was diagnosed with the flu ~1 week ago as well and was in close contact with the patient.  No current facility-administered medications for this encounter.   Current Outpatient Medications:  .  aspirin EC 81 MG tablet, Take 81 mg by mouth daily., Disp: , Rfl:    No Known Allergies  Denies pmh.   Past Surgical History:  Procedure Laterality Date  . VASECTOMY     Family History  Problem Relation Age of Onset  . Heart disease Mother   . Diabetes Mother   . Cancer Mother        unknown type  . Bladder Cancer Father   . Heart disease Father   . Colon cancer Neg Hx   . Stomach cancer Neg Hx   . Rectal cancer Neg Hx   . Liver cancer Neg Hx   . Esophageal cancer Neg Hx     Objective:   Vitals: BP 133/76   Pulse 86   Temp 99.2 F (37.3 C) (Oral)   Resp 18   SpO2 97%   Physical Exam  Constitutional: He is oriented to person, place, and time. He appears well-developed and well-nourished.  HENT:  Right Ear: Tympanic membrane normal.  Left Ear: Tympanic membrane normal.  Nose: No sinus tenderness.  Post-nasal drainage.   Eyes: Pupils are equal, round, and reactive to light. EOM are normal. Right eye exhibits no discharge. Left eye exhibits no discharge. No scleral icterus.  Neck: Normal range of motion. Neck supple.  Cardiovascular: Normal rate, regular rhythm and intact distal pulses. Exam reveals no  gallop and no friction rub.  No murmur heard. Pulmonary/Chest: No stridor. No respiratory distress. He has no wheezes. He has no rales.  Coarse lung sounds over mid-lower right lung fields.  Abdominal: Soft. Bowel sounds are normal. He exhibits no distension and no mass. There is no tenderness. There is no guarding.  Musculoskeletal: He exhibits no edema.  Lymphadenopathy:    He has no cervical adenopathy.  Neurological: He is alert and oriented to person, place, and time. No cranial nerve deficit.  Skin: Skin is warm and dry.  Psychiatric: He has a normal mood and affect.   Assessment and Plan :   Influenza-like illness  Cough  Generalized headaches  Body aches  Malaise and fatigue  Lower respiratory infection  Will manage for lower respiratory infection with azithromycin, cough suppression medications. Counseled patient on potential for adverse effects with medications prescribed today, patient verbalized understanding. Return-to-clinic precautions discussed, patient verbalized understanding.    Wallis BambergMani, Veasna Santibanez, PA-C 08/01/17 1714

## 2017-08-01 NOTE — Discharge Instructions (Addendum)
Hydrate well with at least 2 liters (1 gallon) of water daily. For sore throat try using a honey-based tea. Use 3 teaspoons of honey with juice squeezed from half lemon. Place shaved pieces of ginger into 1/2-1 cup of water and warm over stove top. Then mix the ingredients and repeat every 4 hours as needed. You may take 500mg Tylenol with ibuprofen 400-600mg every 6 hours for pain and inflammation.  °

## 2017-08-01 NOTE — ED Triage Notes (Signed)
Pt here for cough, fever and fatigue x 1 week. Grandchild was positive for the flu A. sts also headaches.

## 2017-12-09 ENCOUNTER — Encounter: Payer: Self-pay | Admitting: Family Medicine

## 2017-12-09 ENCOUNTER — Ambulatory Visit: Payer: Self-pay | Admitting: Family Medicine

## 2017-12-09 VITALS — BP 142/78 | HR 60 | Temp 98.2°F | Resp 18 | Ht 67.0 in | Wt 188.0 lb

## 2017-12-09 DIAGNOSIS — I10 Essential (primary) hypertension: Secondary | ICD-10-CM

## 2017-12-09 DIAGNOSIS — Z7689 Persons encountering health services in other specified circumstances: Secondary | ICD-10-CM

## 2017-12-09 DIAGNOSIS — K219 Gastro-esophageal reflux disease without esophagitis: Secondary | ICD-10-CM

## 2017-12-09 LAB — COMPLETE METABOLIC PANEL WITH GFR
AG Ratio: 1.4 (calc) (ref 1.0–2.5)
ALT: 17 U/L (ref 9–46)
AST: 19 U/L (ref 10–35)
Albumin: 4.2 g/dL (ref 3.6–5.1)
Alkaline phosphatase (APISO): 45 U/L (ref 40–115)
BILIRUBIN TOTAL: 1 mg/dL (ref 0.2–1.2)
BUN: 17 mg/dL (ref 7–25)
CHLORIDE: 107 mmol/L (ref 98–110)
CO2: 26 mmol/L (ref 20–32)
Calcium: 9.1 mg/dL (ref 8.6–10.3)
Creat: 1.11 mg/dL (ref 0.70–1.33)
GFR, Est African American: 84 mL/min/{1.73_m2} (ref >=60)
GFR, Est Non African American: 73 mL/min/{1.73_m2} (ref >=60)
GLUCOSE: 99 mg/dL (ref 65–99)
Globulin: 2.9 g/dL (calc) (ref 1.9–3.7)
Potassium: 4.4 mmol/L (ref 3.5–5.3)
Sodium: 141 mmol/L (ref 135–146)
Total Protein: 7.1 g/dL (ref 6.1–8.1)

## 2017-12-09 LAB — LIPID PANEL
CHOL/HDL RATIO: 4.5 (calc) (ref <5.0)
CHOLESTEROL: 197 mg/dL (ref <200)
HDL: 44 mg/dL (ref >40)
LDL Cholesterol (Calc): 127 mg/dL (calc) — ABNORMAL HIGH
Non-HDL Cholesterol (Calc): 153 mg/dL (calc) — ABNORMAL HIGH (ref <130)
TRIGLYCERIDES: 144 mg/dL (ref <150)

## 2017-12-09 MED ORDER — OMEPRAZOLE 40 MG PO CPDR: 40.0000 mg | DELAYED_RELEASE_CAPSULE | Freq: Every day | ORAL | 3 refills | Status: DC

## 2017-12-09 MED ORDER — LOSARTAN POTASSIUM 50 MG PO TABS: 50.0000 mg | ORAL_TABLET | Freq: Every day | ORAL | 3 refills | Status: DC

## 2017-12-09 NOTE — Progress Notes (Signed)
Subjective:    Patient ID: Devon Riley, male    DOB: 12/10/1960, 57 y.o.   MRN: [id number]  HPI Patient is a 57 year old Caucasian male here today to establish care.  Past medical history is significant for an abnormal EKG.  I reviewed his previous EKGs which shows diffuse T wave inversions primarily in the lateral leads.  He was referred to Dr. Rennis GoldenHilty who performed a stress test which revealed no ischemia and an ejection fraction of 55 to 60%.  This apparently is the patient's baseline EKG.  Past medical history is also significant for a small bowel obstruction in 2018.  He was hospitalized for 4 days and it spontaneously resolved.  He has had no further bowel obstruction since.  His biggest concern today is heartburn.  He states that he wakes up at night with a burning sensation in the center of his chest and acid in his throat.  If he sits up, the symptoms will improve.  He will have a 3 and 4 days in a row.  He denies any chest pain, dyspnea on exertion, angina, shortness of breath.  He does report heartburn throughout the day at times.  He denies any melena or hematochezia or weight loss.  Last colonoscopy was in 2016.  He is overdue for a tetanus shot, hepatitis C screening, prostate cancer screening.  Patient is self-pay and declines a tetanus shot, hepatitis C test.  He defers PSA for the time being.  He does consent for a CMP and fasting lipid panel Past Medical History:  Diagnosis Date  . Abnormal EKG   . Benign essential HTN   . SBO (small bowel obstruction) (HCC)    Past Surgical History:  Procedure Laterality Date  . VASECTOMY     No current outpatient medications on file prior to visit.   No current facility-administered medications on file prior to visit.    No Known Allergies Social History   Socioeconomic History  . Marital status: Divorced    Spouse name: Not on file  . Number of children: 2  . Years of education: Not on file  . Highest education level: Not on file    Occupational History  . Occupation: self-employed  Social Needs  . Financial resource strain: Not on file  . Food insecurity:    Worry: Not on file    Inability: Not on file  . Transportation needs:    Medical: Not on file    Non-medical: Not on file  Tobacco Use  . Smoking status: Never Smoker  . Smokeless tobacco: Never Used  Substance and Sexual Activity  . Alcohol use: Yes    Comment: occassional, 6-10 beers on occasion  . Drug use: No  . Sexual activity: Not on file  Lifestyle  . Physical activity:    Days per week: Not on file    Minutes per session: Not on file  . Stress: Not on file  Relationships  . Social connections:    Talks on phone: Not on file    Gets together: Not on file    Attends religious service: Not on file    Active member of club or organization: Not on file    Attends meetings of clubs or organizations: Not on file    Relationship status: Not on file  . Intimate partner violence:    Fear of current or ex partner: Not on file    Emotionally abused: Not on file    Physically abused: Not on  file    Forced sexual activity: Not on file  Other Topics Concern  . Not on file  Social History Narrative  . Not on file   Family History  Problem Relation Age of Onset  . Heart disease Mother   . Diabetes Mother   . Cancer Mother        lung/liver  . Bladder Cancer Father   . Heart disease Father   . Cancer Father        colon  . Colon cancer Neg Hx   . Stomach cancer Neg Hx   . Rectal cancer Neg Hx   . Liver cancer Neg Hx   . Esophageal cancer Neg Hx       Review of Systems  All other systems reviewed and are negative.      Objective:   Physical Exam  Constitutional: He is oriented to person, place, and time. He appears well-developed and well-nourished. No distress.  HENT:  Head: Normocephalic and atraumatic.  Right Ear: External ear normal.  Left Ear: External ear normal.  Nose: Nose normal.  Mouth/Throat: Oropharynx is clear and  moist. No oropharyngeal exudate.  Eyes: Pupils are equal, round, and reactive to light. Conjunctivae are normal. Right eye exhibits no discharge. Left eye exhibits no discharge. No scleral icterus.  Neck: Normal range of motion. Neck supple. No JVD present. No tracheal deviation present. No thyromegaly present.  Cardiovascular: Normal rate, regular rhythm, normal heart sounds and intact distal pulses. Exam reveals no gallop and no friction rub.  No murmur heard. Pulmonary/Chest: Effort normal and breath sounds normal. No stridor. No respiratory distress. He has no wheezes. He has no rales. He exhibits no tenderness.  Abdominal: Soft. Bowel sounds are normal. He exhibits no distension and no mass. There is no tenderness. There is no rebound and no guarding.  Musculoskeletal: He exhibits no edema.  Lymphadenopathy:    He has no cervical adenopathy.  Neurological: He is alert and oriented to person, place, and time. He displays normal reflexes. No cranial nerve deficit or sensory deficit. He exhibits normal muscle tone. Coordination normal.  Skin: Skin is warm and dry. He is not diaphoretic.  Vitals reviewed.         Assessment & Plan:  Encounter to establish care with new doctor  Benign essential HTN - Plan: COMPLETE METABOLIC PANEL WITH GFR, Lipid panel  Gastroesophageal reflux disease, esophagitis presence not specified  Patient's blood pressure is elevated today.  He states that his blood pressure is averaging 160-170/90-100 at home.  Therefore I will start the patient on losartan 50 mg a day and recheck blood pressure in 1 month.  I will check a CMP and a fasting lipid panel.  He declines PSA, hepatitis C screening, and a tetanus shot.  He does report reflux and we will treat him with omeprazole 40 mg a day and reassess in 2 to 4 weeks.  If symptoms are not improving, I would recommend a GI consultation for EGD to rule out gastritis, etc.

## 2017-12-13 ENCOUNTER — Other Ambulatory Visit: Payer: Self-pay | Admitting: Family Medicine

## 2017-12-13 DIAGNOSIS — E785 Hyperlipidemia, unspecified: Secondary | ICD-10-CM

## 2017-12-13 DIAGNOSIS — I10 Essential (primary) hypertension: Secondary | ICD-10-CM

## 2017-12-13 DIAGNOSIS — Z79899 Other long term (current) drug therapy: Secondary | ICD-10-CM

## 2017-12-13 MED ORDER — ATORVASTATIN CALCIUM 20 MG PO TABS: 20.0000 mg | ORAL_TABLET | Freq: Every day | ORAL | 3 refills | Status: DC

## 2018-01-13 ENCOUNTER — Other Ambulatory Visit: Payer: Self-pay

## 2018-01-13 ENCOUNTER — Ambulatory Visit: Payer: Self-pay | Admitting: Family Medicine

## 2018-01-13 ENCOUNTER — Encounter: Payer: Self-pay | Admitting: Family Medicine

## 2018-01-13 VITALS — BP 140/88 | HR 76 | Temp 98.2°F | Resp 14 | Ht 67.0 in | Wt 191.0 lb

## 2018-01-13 DIAGNOSIS — R21 Rash and other nonspecific skin eruption: Secondary | ICD-10-CM

## 2018-01-13 MED ORDER — HYDROXYZINE HCL 25 MG PO TABS: 25.0000 mg | ORAL_TABLET | Freq: Three times a day (TID) | ORAL | 0 refills | Status: DC | PRN

## 2018-01-13 MED ORDER — PREDNISONE 10 MG (21) PO TBPK: ORAL_TABLET | ORAL | 0 refills | Status: DC

## 2018-01-13 NOTE — Progress Notes (Signed)
Patient ID: Devon Riley, male    DOB: [birth year], 57 y.o.   MRN: [id number]  PCP: Donita Brooks, MD  Chief Complaint  Patient presents with  . Rash    x4 days- irritation to BLE- thought it was chiggar bites, but not improving with home tx    Subjective:   Devon Riley is a 57 y.o. male, presents to clinic with CC of itchy rash x 4 days Rash  This is a new problem. The current episode started in the past 7 days. The problem has been rapidly worsening since onset. The affected locations include the left arm, left wrist, left fingers, right wrist, right hand, right arm, face and head. The rash is characterized by itchiness, burning, redness and bruising. He was exposed to plant contact. Pertinent negatives include no anorexia, congestion, cough, diarrhea, eye pain, facial edema, fatigue, fever, joint pain, nail changes, rhinorrhea, shortness of breath, sore throat or vomiting. Treatments tried: chigger OTC tx. The treatment provided no relief. There is no history of allergies, asthma, eczema or varicella.  first noticed rash to b/l arms and wrists after mowing the grass and doing yard work. Area on right cheek was there on the first day as well, and has gradually spread up arms and to neck and few spots to head, neck and trunk.  Red raised bumps and some fluid filled blisters    Patient Active Problem List   Diagnosis Date Noted  . Benign essential HTN   . Apical variant hypertrophic cardiomyopathy (HCC) 04/16/2017  . Abnormal EKG 01/07/2017  . Chest pain 01/07/2017  . Murmur 01/07/2017  . HTN (hypertension) 08/12/2016  . Bradycardia 08/12/2016  . Gastroenteritis 08/12/2016     Prior to Admission medications   Medication Sig Start Date End Date Taking? Authorizing Provider  atorvastatin (LIPITOR) 20 MG tablet Take 1 tablet (20 mg total) by mouth daily. 12/13/17  Yes Donita Brooks, MD  losartan (COZAAR) 50 MG tablet Take 1 tablet (50 mg total) by mouth daily. 12/09/17   Yes Donita Brooks, MD  omeprazole (PRILOSEC) 40 MG capsule Take 1 capsule (40 mg total) by mouth daily. 12/09/17  Yes Donita Brooks, MD  hydrOXYzine (ATARAX/VISTARIL) 25 MG tablet Take 1 tablet (25 mg total) by mouth 3 (three) times daily as needed for itching. 01/13/18   Danelle Berry, PA-C  predniSONE (STERAPRED UNI-PAK 21 TAB) 10 MG (21) TBPK tablet Take as directed on package.  (60 mg po on day 1, 50 mg po on day 2...) 01/13/18   Danelle Berry, PA-C     No Known Allergies   Family History  Problem Relation Age of Onset  . Heart disease Mother   . Diabetes Mother   . Cancer Mother        lung/liver  . Bladder Cancer Father   . Heart disease Father   . Cancer Father        colon  . Colon cancer Neg Hx   . Stomach cancer Neg Hx   . Rectal cancer Neg Hx   . Liver cancer Neg Hx   . Esophageal cancer Neg Hx      Social History   Socioeconomic History  . Marital status: Divorced    Spouse name: Not on file  . Number of children: 2  . Years of education: Not on file  . Highest education level: Not on file  Occupational History  . Occupation: self-employed  Social Needs  . Physicist, medical  strain: Not on file  . Food insecurity:    Worry: Not on file    Inability: Not on file  . Transportation needs:    Medical: Not on file    Non-medical: Not on file  Tobacco Use  . Smoking status: Never Smoker  . Smokeless tobacco: Never Used  Substance and Sexual Activity  . Alcohol use: Yes    Comment: occassional, 6-10 beers on occasion  . Drug use: No  . Sexual activity: Not on file  Lifestyle  . Physical activity:    Days per week: Not on file    Minutes per session: Not on file  . Stress: Not on file  Relationships  . Social connections:    Talks on phone: Not on file    Gets together: Not on file    Attends religious service: Not on file    Active member of club or organization: Not on file    Attends meetings of clubs or organizations: Not on file     Relationship status: Not on file  . Intimate partner violence:    Fear of current or ex partner: Not on file    Emotionally abused: Not on file    Physically abused: Not on file    Forced sexual activity: Not on file  Other Topics Concern  . Not on file  Social History Narrative  . Not on file     Review of Systems  Constitutional: Negative.  Negative for fatigue and fever.  HENT: Negative.  Negative for congestion, rhinorrhea and sore throat.   Eyes: Negative.  Negative for pain.  Respiratory: Negative.  Negative for cough and shortness of breath.   Cardiovascular: Negative.   Gastrointestinal: Negative.  Negative for anorexia, diarrhea and vomiting.  Endocrine: Negative.   Genitourinary: Negative.   Musculoskeletal: Negative.  Negative for joint pain.  Skin: Positive for rash. Negative for nail changes.  Allergic/Immunologic: Negative.   Neurological: Negative.   Hematological: Negative.   Psychiatric/Behavioral: Negative.   All other systems reviewed and are negative.      Objective:    Vitals:   01/13/18 1156  BP: 140/88  Pulse: 76  Resp: 14  Temp: 98.2 F (36.8 C)  TempSrc: Oral  SpO2: 97%  Weight: 191 lb (86.6 kg)  Height: 5\' 7"  (1.702 m)      Physical Exam  Constitutional: He appears well-developed.  HENT:  Head: Normocephalic and atraumatic.  Nose: Nose normal.  Eyes: Conjunctivae are normal. Right eye exhibits no discharge. Left eye exhibits no discharge.  Neck: No tracheal deviation present.  Cardiovascular: Normal rate and regular rhythm.  Pulmonary/Chest: Effort normal. No stridor. No respiratory distress.  Musculoskeletal: Normal range of motion.  Neurological: He is alert. He exhibits normal muscle tone. Coordination normal.  Skin: Skin is warm and dry. Capillary refill takes less than 2 seconds. Rash noted.  Scattered erythematous papular and vesicular rash to b/l arms, more concentrated to wrists and forearm, clusters to right face, some to  behind left ear with more patchy appearance. No rash or burr holes noted between fingers. In all areas of rash no edema, induration, warmth, tenderness.    Psychiatric: He has a normal mood and affect. His behavior is normal.  Nursing note and vitals reviewed.         Assessment & Plan:      ICD-10-CM   1. Rash and nonspecific skin eruption R21     Suspect contact dermatitis secondary to plant exposure.  Steroid  taper with hydroxyzine for itching.  Topical steroid to more thickened/patchy areas.  F/up as needed if not improving or with any rebound.   Hx of DM, recently returned to clinic after being lost to follow up, DM sig improved from past couple years, discussed with pt temporary elevated sugars secondary to prednisone, but should not cause problem with short term us.   Danelle BerryLeisa Norrine Ballester, PA-C 01/13/18 12:09 PM

## 2018-01-27 ENCOUNTER — Ambulatory Visit: Payer: Self-pay | Admitting: Family Medicine

## 2018-01-27 VITALS — BP 154/80 | HR 78 | Temp 98.1°F | Resp 18 | Ht 67.0 in | Wt 194.0 lb

## 2018-01-27 DIAGNOSIS — I10 Essential (primary) hypertension: Secondary | ICD-10-CM

## 2018-01-27 DIAGNOSIS — M549 Dorsalgia, unspecified: Secondary | ICD-10-CM

## 2018-01-27 MED ORDER — HYDROCHLOROTHIAZIDE 25 MG PO TABS: 25.0000 mg | ORAL_TABLET | Freq: Every day | ORAL | 3 refills | Status: DC

## 2018-01-27 NOTE — Progress Notes (Signed)
Subjective:    Patient ID: Devon Riley, male    DOB: [birth year], 59 y.o.   MRN: [id number]  HPI  12/09/17 Patient is a 57 year old Caucasian male here today to establish care.  Past medical history is significant for an abnormal EKG.  I reviewed his previous EKGs which shows diffuse T wave inversions primarily in the lateral leads.  He was referred to Dr. Rennis Golden who performed a stress test which revealed no ischemia and an ejection fraction of 55 to 60%.  This apparently is the patient's baseline EKG.  Past medical history is also significant for a small bowel obstruction in 2018.  He was hospitalized for 4 days and it spontaneously resolved.  He has had no further bowel obstruction since.  His biggest concern today is heartburn.  He states that he wakes up at night with a burning sensation in the center of his chest and acid in his throat.  If he sits up, the symptoms will improve.  He will have a 3 and 4 days in a row.  He denies any chest pain, dyspnea on exertion, angina, shortness of breath.  He does report heartburn throughout the day at times.  He denies any melena or hematochezia or weight loss.  Last colonoscopy was in 2016.  He is overdue for a tetanus shot, hepatitis C screening, prostate cancer screening.  Patient is self-pay and declines a tetanus shot, hepatitis C test.  He defers PSA for the time being.  He does consent for a CMP and fasting lipid panel.  At that time, my plan was: Patient's blood pressure is elevated today.  He states that his blood pressure is averaging 160-170/90-100 at home.  Therefore I will start the patient on losartan 50 mg a day and recheck blood pressure in 1 month.  I will check a CMP and a fasting lipid panel.  He declines PSA, hepatitis C screening, and a tetanus shot.  He does report reflux and we will treat him with omeprazole 40 mg a day and reassess in 2 to 4 weeks.  If symptoms are not improving, I would recommend a GI consultation for EGD to rule out  gastritis, etc.  01/27/18 Patient states that he eventually started taking losartan 100 mg a day.  Despite taking losartan 100 mg a day, his blood pressure consistently was in the 170s over 90-100.  After stopping the losartan, his blood pressure is actually a little bit better and is in the 150s to 160s over 90s.  He is here today to discuss options for blood pressure control.  He does not want to continue losartan.  He also reports several weeks of pain in between his shoulder blades roughly at the level of T3.  He denies any pleurisy.  He denies any shortness of breath.  He denies any hemoptysis.  He denies any heartburn or indigestion.  Pain does not radiate but instead is located in a bandlike pattern following the T3 dermatome from shoulder blade shoulder blade. Past Medical History:  Diagnosis Date  . Abnormal EKG   . Benign essential HTN   . SBO (small bowel obstruction) (HCC)    Past Surgical History:  Procedure Laterality Date  . VASECTOMY     Current Outpatient Medications on File Prior to Visit  Medication Sig Dispense Refill  . atorvastatin (LIPITOR) 20 MG tablet Take 1 tablet (20 mg total) by mouth daily. 30 tablet 3  . hydrOXYzine (ATARAX/VISTARIL) 25 MG tablet Take 1 tablet (25  mg total) by mouth 3 (three) times daily as needed for itching. 30 tablet 0  . losartan (COZAAR) 50 MG tablet Take 1 tablet (50 mg total) by mouth daily. 30 tablet 3  . omeprazole (PRILOSEC) 40 MG capsule Take 1 capsule (40 mg total) by mouth daily. 30 capsule 3  . predniSONE (STERAPRED UNI-PAK 21 TAB) 10 MG (21) TBPK tablet Take as directed on package.  (60 mg po on day 1, 50 mg po on day 2...) 21 tablet 0   No current facility-administered medications on file prior to visit.    No Known Allergies Social History   Socioeconomic History  . Marital status: Divorced    Spouse name: Not on file  . Number of children: 2  . Years of education: Not on file  . Highest education level: Not on file    Occupational History  . Occupation: self-employed  Social Needs  . Financial resource strain: Not on file  . Food insecurity:    Worry: Not on file    Inability: Not on file  . Transportation needs:    Medical: Not on file    Non-medical: Not on file  Tobacco Use  . Smoking status: Never Smoker  . Smokeless tobacco: Never Used  Substance and Sexual Activity  . Alcohol use: Yes    Comment: occassional, 6-10 beers on occasion  . Drug use: No  . Sexual activity: Not on file  Lifestyle  . Physical activity:    Days per week: Not on file    Minutes per session: Not on file  . Stress: Not on file  Relationships  . Social connections:    Talks on phone: Not on file    Gets together: Not on file    Attends religious service: Not on file    Active member of club or organization: Not on file    Attends meetings of clubs or organizations: Not on file    Relationship status: Not on file  . Intimate partner violence:    Fear of current or ex partner: Not on file    Emotionally abused: Not on file    Physically abused: Not on file    Forced sexual activity: Not on file  Other Topics Concern  . Not on file  Social History Narrative  . Not on file   Family History  Problem Relation Age of Onset  . Heart disease Mother   . Diabetes Mother   . Cancer Mother        lung/liver  . Bladder Cancer Father   . Heart disease Father   . Cancer Father        colon  . Colon cancer Neg Hx   . Stomach cancer Neg Hx   . Rectal cancer Neg Hx   . Liver cancer Neg Hx   . Esophageal cancer Neg Hx       Review of Systems  All other systems reviewed and are negative.      Objective:   Physical Exam  Constitutional: He is oriented to person, place, and time. He appears well-developed and well-nourished. No distress.  HENT:  Head: Normocephalic and atraumatic.  Right Ear: External ear normal.  Left Ear: External ear normal.  Nose: Nose normal.  Mouth/Throat: Oropharynx is clear and  moist. No oropharyngeal exudate.  Eyes: Pupils are equal, round, and reactive to light. Conjunctivae are normal. Right eye exhibits no discharge. Left eye exhibits no discharge. No scleral icterus.  Neck: Normal range of  motion. Neck supple. No JVD present. No tracheal deviation present. No thyromegaly present.  Cardiovascular: Normal rate, regular rhythm, normal heart sounds and intact distal pulses. Exam reveals no gallop and no friction rub.  No murmur heard. Pulmonary/Chest: Effort normal and breath sounds normal. No stridor. No respiratory distress. He has no wheezes. He has no rales. He exhibits no tenderness.  Abdominal: Soft. Bowel sounds are normal. He exhibits no distension and no mass. There is no tenderness. There is no rebound and no guarding.  Musculoskeletal: He exhibits no edema.  Lymphadenopathy:    He has no cervical adenopathy.  Neurological: He is alert and oriented to person, place, and time. He displays normal reflexes. No cranial nerve deficit or sensory deficit. He exhibits normal muscle tone. Coordination normal.  Skin: Skin is warm and dry. He is not diaphoretic.  Vitals reviewed.         Assessment & Plan:  Mid back pain - Plan: DG Thoracic Spine W/Swimmers  I believe the back pain is most likely muscular.  I recommended that we get a thoracic spine x-ray to rule out vertebral fracture or other evidence of degenerative disc disease.  Patient would like that.  We will start the patient on HCTZ 25 mg p.o. daily and recheck blood pressure in 2 weeks.  If blood pressures no better I would add amlodipine.

## 2018-02-02 ENCOUNTER — Ambulatory Visit
Admission: RE | Admit: 2018-02-02 | Discharge: 2018-02-02 | Disposition: A | Discharge status: Home / Self Care | Payer: Self-pay | Source: Ambulatory Visit | Attending: Family Medicine | Admitting: Family Medicine

## 2018-02-02 DIAGNOSIS — M549 Dorsalgia, unspecified: Secondary | ICD-10-CM

## 2018-03-04 ENCOUNTER — Other Ambulatory Visit: Payer: Self-pay | Admitting: Family Medicine

## 2018-03-29 ENCOUNTER — Other Ambulatory Visit: Payer: Self-pay | Admitting: Family Medicine

## 2018-05-09 ENCOUNTER — Encounter: Payer: Self-pay | Admitting: Family Medicine

## 2018-05-09 ENCOUNTER — Ambulatory Visit: Payer: Self-pay | Admitting: Family Medicine

## 2018-05-09 VITALS — BP 150/70 | HR 70 | Temp 97.6°F | Resp 18 | Ht 67.0 in | Wt 198.0 lb

## 2018-05-09 DIAGNOSIS — J329 Chronic sinusitis, unspecified: Secondary | ICD-10-CM

## 2018-05-09 DIAGNOSIS — J310 Chronic rhinitis: Secondary | ICD-10-CM

## 2018-05-09 MED ORDER — AMOXICILLIN 875 MG PO TABS: 875.0000 mg | ORAL_TABLET | Freq: Two times a day (BID) | ORAL | 0 refills | Status: DC

## 2018-05-09 MED ORDER — FLUTICASONE PROPIONATE 50 MCG/ACT NA SUSP: 2.0000 | Freq: Every day | NASAL | 6 refills | Status: DC

## 2018-05-09 NOTE — Progress Notes (Signed)
Subjective:    Patient ID: Devon Riley, male    DOB: 1960/06/07, 58 y.o.   MRN: [id number]  HPI  12/09/17 Patient is a 58 year old Caucasian male here today to establish care.  Past medical history is significant for an abnormal EKG.  I reviewed his previous EKGs which shows diffuse T wave inversions primarily in the lateral leads.  He was referred to Dr. Rennis Golden who performed a stress test which revealed no ischemia and an ejection fraction of 55 to 60%.  This apparently is the patient's baseline EKG.  Past medical history is also significant for a small bowel obstruction in 2018.  He was hospitalized for 4 days and it spontaneously resolved.  He has had no further bowel obstruction since.  His biggest concern today is heartburn.  He states that he wakes up at night with a burning sensation in the center of his chest and acid in his throat.  If he sits up, the symptoms will improve.  He will have a 3 and 4 days in a row.  He denies any chest pain, dyspnea on exertion, angina, shortness of breath.  He does report heartburn throughout the day at times.  He denies any melena or hematochezia or weight loss.  Last colonoscopy was in 2016.  He is overdue for a tetanus shot, hepatitis C screening, prostate cancer screening.  Patient is self-pay and declines a tetanus shot, hepatitis C test.  He defers PSA for the time being.  He does consent for a CMP and fasting lipid panel.  At that time, my plan was: Patient's blood pressure is elevated today.  He states that his blood pressure is averaging 160-170/90-100 at home.  Therefore I will start the patient on losartan 50 mg a day and recheck blood pressure in 1 month.  I will check a CMP and a fasting lipid panel.  He declines PSA, hepatitis C screening, and a tetanus shot.  He does report reflux and we will treat him with omeprazole 40 mg a day and reassess in 2 to 4 weeks.  If symptoms are not improving, I would recommend a GI consultation for EGD to rule out  gastritis, etc.  01/27/18 Patient states that he eventually started taking losartan 100 mg a day.  Despite taking losartan 100 mg a day, his blood pressure consistently was in the 170s over 90-100.  After stopping the losartan, his blood pressure is actually a little bit better and is in the 150s to 160s over 90s.  He is here today to discuss options for blood pressure control.  He does not want to continue losartan.  He also reports several weeks of pain in between his shoulder blades roughly at the level of T3.  He denies any pleurisy.  He denies any shortness of breath.  He denies any hemoptysis.  He denies any heartburn or indigestion.  Pain does not radiate but instead is located in a bandlike pattern following the T3 dermatome from shoulder blade shoulder blade.  At that time, my plan was: I believe the back pain is most likely muscular.  I recommended that we get a thoracic spine x-ray to rule out vertebral fracture or other evidence of degenerative disc disease.  Patient would like that.  We will start the patient on HCTZ 25 mg p.o. daily and recheck blood pressure in 2 weeks.  If blood pressures no better I would add amlodipine.  05/09/18 Patient reports head congestion now for more than 14 days.  He reports postnasal drip.  He reports dull headache and subjective fevers.  He reports sore throat due to persistent constant nasal drainage.  He also has a cough due to postnasal drip.  He is tried Tylenol Cold and sinus, NyQuil, allergy medication over the last 2 weeks without any relief.  He does report sinus pressure. Past Medical History:  Diagnosis Date  . Abnormal EKG   . Benign essential HTN   . SBO (small bowel obstruction) (HCC)    Past Surgical History:  Procedure Laterality Date  . VASECTOMY     Current Outpatient Medications on File Prior to Visit  Medication Sig Dispense Refill  . atorvastatin (LIPITOR) 20 MG tablet TAKE 1 TABLET BY MOUTH EVERY DAY 90 tablet 1  .  hydrochlorothiazide (HYDRODIURIL) 25 MG tablet Take 1 tablet (25 mg total) by mouth daily. 90 tablet 3  . hydrOXYzine (ATARAX/VISTARIL) 25 MG tablet Take 1 tablet (25 mg total) by mouth 3 (three) times daily as needed for itching. 30 tablet 0  . losartan (COZAAR) 100 MG tablet TAKE 1/2 TABLET BY MOUTH EVERY DAY 45 tablet 3  . omeprazole (PRILOSEC) 40 MG capsule TAKE 1 CAPSULE BY MOUTH EVERY DAY 90 capsule 3   No current facility-administered medications on file prior to visit.    No Known Allergies Social History   Socioeconomic History  . Marital status: Divorced    Spouse name: Not on file  . Number of children: 2  . Years of education: Not on file  . Highest education level: Not on file  Occupational History  . Occupation: self-employed  Social Needs  . Financial resource strain: Not on file  . Food insecurity:    Worry: Not on file    Inability: Not on file  . Transportation needs:    Medical: Not on file    Non-medical: Not on file  Tobacco Use  . Smoking status: Never Smoker  . Smokeless tobacco: Never Used  Substance and Sexual Activity  . Alcohol use: Yes    Comment: occassional, 6-10 beers on occasion  . Drug use: No  . Sexual activity: Not on file  Lifestyle  . Physical activity:    Days per week: Not on file    Minutes per session: Not on file  . Stress: Not on file  Relationships  . Social connections:    Talks on phone: Not on file    Gets together: Not on file    Attends religious service: Not on file    Active member of club or organization: Not on file    Attends meetings of clubs or organizations: Not on file    Relationship status: Not on file  . Intimate partner violence:    Fear of current or ex partner: Not on file    Emotionally abused: Not on file    Physically abused: Not on file    Forced sexual activity: Not on file  Other Topics Concern  . Not on file  Social History Narrative  . Not on file   Family History  Problem Relation Age of  Onset  . Heart disease Mother   . Diabetes Mother   . Cancer Mother        lung/liver  . Bladder Cancer Father   . Heart disease Father   . Cancer Father        colon  . Colon cancer Neg Hx   . Stomach cancer Neg Hx   . Rectal cancer Neg Hx   .  Liver cancer Neg Hx   . Esophageal cancer Neg Hx       Review of Systems  All other systems reviewed and are negative.      Objective:   Physical Exam Vitals signs reviewed.  Constitutional:      General: He is not in acute distress.    Appearance: He is well-developed. He is not diaphoretic.  HENT:     Head: Normocephalic and atraumatic.     Right Ear: External ear normal.     Left Ear: External ear normal.     Nose: Mucosal edema, congestion and rhinorrhea present.     Right Turbinates: Enlarged and swollen.     Left Turbinates: Enlarged and swollen.     Right Sinus: No maxillary sinus tenderness or frontal sinus tenderness.     Left Sinus: No maxillary sinus tenderness or frontal sinus tenderness.     Mouth/Throat:     Pharynx: No oropharyngeal exudate.  Eyes:     General: No scleral icterus.       Right eye: No discharge.        Left eye: No discharge.     Conjunctiva/sclera: Conjunctivae normal.     Pupils: Pupils are equal, round, and reactive to light.  Neck:     Musculoskeletal: Normal range of motion and neck supple.     Thyroid: No thyromegaly.     Vascular: No JVD.     Trachea: No tracheal deviation.  Cardiovascular:     Rate and Rhythm: Normal rate and regular rhythm.     Heart sounds: Normal heart sounds. No murmur. No friction rub. No gallop.   Pulmonary:     Effort: Pulmonary effort is normal. No respiratory distress.     Breath sounds: Normal breath sounds. No stridor. No wheezing or rales.  Chest:     Chest wall: No tenderness.  Abdominal:     General: Bowel sounds are normal. There is no distension.     Palpations: Abdomen is soft. There is no mass.     Tenderness: There is no abdominal  tenderness. There is no guarding or rebound.  Lymphadenopathy:     Cervical: No cervical adenopathy.  Skin:    General: Skin is warm and dry.  Neurological:     Mental Status: He is alert and oriented to person, place, and time.     Cranial Nerves: No cranial nerve deficit.     Sensory: No sensory deficit.     Motor: No abnormal muscle tone.     Coordination: Coordination normal.     Deep Tendon Reflexes: Reflexes normal.           Assessment & Plan:  Given the duration of symptoms, believe this is turned into rhinosinusitis.  Recommended amoxicillin 875 mg p.o. twice daily for 10 days with Flonase 2 sprays each nostril daily

## 2018-10-03 ENCOUNTER — Other Ambulatory Visit: Payer: Self-pay | Admitting: Family Medicine

## 2019-02-14 ENCOUNTER — Other Ambulatory Visit: Payer: Self-pay | Admitting: Family Medicine

## 2019-02-21 ENCOUNTER — Other Ambulatory Visit: Payer: Self-pay | Admitting: Family Medicine

## 2019-03-03 ENCOUNTER — Other Ambulatory Visit: Payer: Self-pay

## 2019-03-03 ENCOUNTER — Encounter: Payer: Self-pay | Admitting: Family Medicine

## 2019-03-03 ENCOUNTER — Ambulatory Visit (INDEPENDENT_AMBULATORY_CARE_PROVIDER_SITE_OTHER): Payer: Self-pay | Admitting: Family Medicine

## 2019-03-03 VITALS — BP 112/68 | HR 88 | Temp 97.7°F | Resp 16 | Ht 67.0 in | Wt 195.0 lb

## 2019-03-03 DIAGNOSIS — I422 Other hypertrophic cardiomyopathy: Secondary | ICD-10-CM

## 2019-03-03 DIAGNOSIS — Z125 Encounter for screening for malignant neoplasm of prostate: Secondary | ICD-10-CM

## 2019-03-03 DIAGNOSIS — E7800 Pure hypercholesterolemia, unspecified: Secondary | ICD-10-CM

## 2019-03-03 DIAGNOSIS — I10 Essential (primary) hypertension: Secondary | ICD-10-CM

## 2019-03-03 MED ORDER — HYDROCHLOROTHIAZIDE 25 MG PO TABS: 25.0000 mg | ORAL_TABLET | Freq: Every day | ORAL | 3 refills | Status: DC

## 2019-03-03 MED ORDER — PANTOPRAZOLE SODIUM 40 MG PO TBEC: 40.0000 mg | DELAYED_RELEASE_TABLET | Freq: Every day | ORAL | 3 refills | Status: DC

## 2019-03-03 MED ORDER — LOSARTAN POTASSIUM 100 MG PO TABS: 50.0000 mg | ORAL_TABLET | Freq: Every day | ORAL | 3 refills | Status: DC

## 2019-03-03 NOTE — Progress Notes (Signed)
Subjective:    Patient ID: Devon Riley, male    DOB: 1960-12-15, 58 y.o.   MRN: [id number]  HPI Patient presents today for a follow-up of his chronic medical problems.  Patient is a very pleasant 58 year old Caucasian male.  He denies any concerns.  He discontinued omeprazole because it was upsetting his stomach.  He does report breakthrough reflux symptoms primarily at night.  This usually occurs if he eats late or if he eats something spicy.  He is unable to elevate the head of his bed because his partner has a difficult time sleeping with an elevated bed.  He denies any melena or hematochezia.  His colonoscopy was in 2016 and was normal and is not due again until 2026.  He is overdue for a PSA.  He is also due for hepatitis C testing however the patient politely declines hepatitis C testing due to lack of risk factors.  He is also due for a tetanus shot but he politely declines this today.  We discussed the flu shot at length and the patient declines his flu shot as well.  Past medical history is significant for an abnormal EKG.  I reviewed his previous EKGs which shows diffuse T wave inversions primarily in the lateral leads.  He was referred to Dr. Debara Pickett who performed a stress test which revealed no ischemia and an ejection fraction of 55 to 60%.  This apparently is the patient's baseline EKG.  Past medical history is also significant for a small bowel obstruction in 2018.  He was hospitalized for 4 days and it spontaneously resolved.  He has had no further bowel obstruction since.  Past Medical History:  Diagnosis Date  . Abnormal EKG   . Benign essential HTN   . SBO (small bowel obstruction) (HCC)    Past Surgical History:  Procedure Laterality Date  . VASECTOMY     Current Outpatient Medications on File Prior to Visit  Medication Sig Dispense Refill  . fluticasone (FLONASE) 50 MCG/ACT nasal spray Place 2 sprays into both nostrils daily. 16 g 6  . hydrochlorothiazide (HYDRODIURIL) 25 MG  tablet TAKE 1 TABLET BY MOUTH EVERY DAY 30 tablet 0  . losartan (COZAAR) 100 MG tablet Take 50 mg by mouth daily.    Marland Kitchen atorvastatin (LIPITOR) 20 MG tablet TAKE 1 TABLET BY MOUTH EVERY DAY (Patient not taking: Reported on 03/03/2019) 90 tablet 0   No current facility-administered medications on file prior to visit.    No Known Allergies Social History   Socioeconomic History  . Marital status: Divorced    Spouse name: Not on file  . Number of children: 2  . Years of education: Not on file  . Highest education level: Not on file  Occupational History  . Occupation: self-employed  Social Needs  . Financial resource strain: Not on file  . Food insecurity    Worry: Not on file    Inability: Not on file  . Transportation needs    Medical: Not on file    Non-medical: Not on file  Tobacco Use  . Smoking status: Never Smoker  . Smokeless tobacco: Never Used  Substance and Sexual Activity  . Alcohol use: Yes    Comment: occassional, 6-10 beers on occasion  . Drug use: No  . Sexual activity: Not on file  Lifestyle  . Physical activity    Days per week: Not on file    Minutes per session: Not on file  . Stress: Not on  file  Relationships  . Social Musicianconnections    Talks on phone: Not on file    Gets together: Not on file    Attends religious service: Not on file    Active member of club or organization: Not on file    Attends meetings of clubs or organizations: Not on file    Relationship status: Not on file  . Intimate partner violence    Fear of current or ex partner: Not on file    Emotionally abused: Not on file    Physically abused: Not on file    Forced sexual activity: Not on file  Other Topics Concern  . Not on file  Social History Narrative  . Not on file   Family History  Problem Relation Age of Onset  . Heart disease Mother   . Diabetes Mother   . Cancer Mother        lung/liver  . Bladder Cancer Father   . Heart disease Father   . Cancer Father         colon  . Colon cancer Neg Hx   . Stomach cancer Neg Hx   . Rectal cancer Neg Hx   . Liver cancer Neg Hx   . Esophageal cancer Neg Hx       Review of Systems  All other systems reviewed and are negative.      Objective:   Physical Exam Vitals signs reviewed.  Constitutional:      General: He is not in acute distress.    Appearance: He is well-developed. He is not diaphoretic.  HENT:     Head: Normocephalic and atraumatic.     Right Ear: External ear normal.     Left Ear: External ear normal.     Nose: Nose normal.     Mouth/Throat:     Pharynx: No oropharyngeal exudate.  Eyes:     General: No scleral icterus.       Right eye: No discharge.        Left eye: No discharge.     Conjunctiva/sclera: Conjunctivae normal.     Pupils: Pupils are equal, round, and reactive to light.  Neck:     Musculoskeletal: Normal range of motion and neck supple.     Thyroid: No thyromegaly.     Vascular: No JVD.     Trachea: No tracheal deviation.  Cardiovascular:     Rate and Rhythm: Normal rate and regular rhythm.     Heart sounds: Normal heart sounds. No murmur. No friction rub. No gallop.   Pulmonary:     Effort: Pulmonary effort is normal. No respiratory distress.     Breath sounds: Normal breath sounds. No stridor. No wheezing or rales.  Chest:     Chest wall: No tenderness.  Abdominal:     General: Bowel sounds are normal. There is no distension.     Palpations: Abdomen is soft. There is no mass.     Tenderness: There is no abdominal tenderness. There is no guarding or rebound.  Lymphadenopathy:     Cervical: No cervical adenopathy.  Skin:    General: Skin is warm and dry.  Neurological:     Mental Status: He is alert and oriented to person, place, and time.     Cranial Nerves: No cranial nerve deficit.     Sensory: No sensory deficit.     Motor: No abnormal muscle tone.     Coordination: Coordination normal.     Deep Tendon Reflexes:  Reflexes normal.            Assessment & Plan:  Benign essential HTN - Plan: CBC with Differential/Platelet, COMPLETE METABOLIC PANEL WITH GFR, Lipid panel  Pure hypercholesterolemia - Plan: CBC with Differential/Platelet, COMPLETE METABOLIC PANEL WITH GFR, Lipid panel  Apical variant hypertrophic cardiomyopathy (HCC)  Prostate cancer screening - Plan: PSA  Blood pressure is outstanding.  I will make no changes in his hydrochlorothiazide or his losartan.  I offered the patient a flu shot and a tetanus shot however he politely declines.  If he changes his mind he can return at any time to get these.  Colonoscopy is not due for another 6 years.  Will screen for prostate cancer with a PSA.  Switch the patient's medicine to Protonix 40 mg a day for reflux.  He can call me back if this medication does not work.  Check fasting lipid panel.  Goal LDL cholesterol is less than 100.

## 2019-03-04 LAB — COMPLETE METABOLIC PANEL WITH GFR
AG Ratio: 1.3 (calc) (ref 1.0–2.5)
ALT: 16 U/L (ref 9–46)
AST: 22 U/L (ref 10–35)
Albumin: 4.3 g/dL (ref 3.6–5.1)
Alkaline phosphatase (APISO): 50 U/L (ref 35–144)
BUN/Creatinine Ratio: 16 (calc) (ref 6–22)
BUN: 27 mg/dL — ABNORMAL HIGH (ref 7–25)
CO2: 26 mmol/L (ref 20–32)
Calcium: 9.8 mg/dL (ref 8.6–10.3)
Chloride: 103 mmol/L (ref 98–110)
Creat: 1.74 mg/dL — ABNORMAL HIGH (ref 0.70–1.25)
GFR, Est African American: 48 mL/min/{1.73_m2} — ABNORMAL LOW (ref >=60)
GFR, Est Non African American: 42 mL/min/{1.73_m2} — ABNORMAL LOW (ref >=60)
Globulin: 3.2 g/dL (calc) (ref 1.9–3.7)
Glucose, Bld: 87 mg/dL (ref 65–99)
Potassium: 3.7 mmol/L (ref 3.5–5.3)
Sodium: 138 mmol/L (ref 135–146)
Total Bilirubin: 0.7 mg/dL (ref 0.2–1.2)
Total Protein: 7.5 g/dL (ref 6.1–8.1)

## 2019-03-04 LAB — LIPID PANEL
Cholesterol: 201 mg/dL — ABNORMAL HIGH (ref <200)
HDL: 36 mg/dL — ABNORMAL LOW (ref >=40)
LDL Cholesterol (Calc): 127 mg/dL (calc) — ABNORMAL HIGH
Non-HDL Cholesterol (Calc): 165 mg/dL (calc) — ABNORMAL HIGH (ref <130)
Total CHOL/HDL Ratio: 5.6 (calc) — ABNORMAL HIGH (ref <5.0)
Triglycerides: 234 mg/dL — ABNORMAL HIGH (ref <150)

## 2019-03-04 LAB — CBC WITH DIFFERENTIAL/PLATELET
Absolute Monocytes: 864 cells/uL (ref 200–950)
Basophils Absolute: 38 cells/uL (ref 0–200)
Basophils Relative: 0.4 %
Eosinophils Absolute: 58 cells/uL (ref 15–500)
Eosinophils Relative: 0.6 %
HCT: 36.4 % — ABNORMAL LOW (ref 38.5–50.0)
Hemoglobin: 12.5 g/dL — ABNORMAL LOW (ref 13.2–17.1)
Lymphs Abs: 2918 cells/uL (ref 850–3900)
MCH: 33.5 pg — ABNORMAL HIGH (ref 27.0–33.0)
MCHC: 34.3 g/dL (ref 32.0–36.0)
MCV: 97.6 fL (ref 80.0–100.0)
MPV: 11.5 fL (ref 7.5–12.5)
Monocytes Relative: 9 %
Neutro Abs: 5722 cells/uL (ref 1500–7800)
Neutrophils Relative %: 59.6 %
Platelets: 255 10*3/uL (ref 140–400)
RBC: 3.73 10*6/uL — ABNORMAL LOW (ref 4.20–5.80)
RDW: 12.9 % (ref 11.0–15.0)
Total Lymphocyte: 30.4 %
WBC: 9.6 10*3/uL (ref 3.8–10.8)

## 2019-03-04 LAB — PSA: PSA: 0.6 ng/mL (ref <=4.0)

## 2019-03-14 ENCOUNTER — Ambulatory Visit: Payer: Self-pay | Admitting: Family Medicine

## 2019-03-17 ENCOUNTER — Other Ambulatory Visit: Payer: Self-pay

## 2019-03-17 ENCOUNTER — Encounter: Payer: Self-pay | Admitting: Family Medicine

## 2019-03-17 ENCOUNTER — Ambulatory Visit: Payer: Self-pay | Admitting: Family Medicine

## 2019-03-17 VITALS — BP 142/80 | HR 62 | Temp 97.6°F | Resp 16 | Ht 67.0 in | Wt 200.0 lb

## 2019-03-17 DIAGNOSIS — N289 Disorder of kidney and ureter, unspecified: Secondary | ICD-10-CM

## 2019-03-17 MED ORDER — AMLODIPINE BESYLATE 10 MG PO TABS: 10.0000 mg | ORAL_TABLET | Freq: Every day | ORAL | 3 refills | Status: DC

## 2019-03-17 NOTE — Progress Notes (Signed)
Subjective:    Patient ID: Devon Riley, male    DOB: 09/16/1960, 58 y.o.   MRN: [id number]  Medication Refill  03/03/19 Patient presents today for a follow-up of his chronic medical problems.  Patient is a very pleasant 58 year old Caucasian male.  He denies any concerns.  He discontinued omeprazole because it was upsetting his stomach.  He does report breakthrough reflux symptoms primarily at night.  This usually occurs if he eats late or if he eats something spicy.  He is unable to elevate the head of his bed because his partner has a difficult time sleeping with an elevated bed.  He denies any melena or hematochezia.  His colonoscopy was in 2016 and was normal and is not due again until 2026.  He is overdue for a PSA.  He is also due for hepatitis C testing however the patient politely declines hepatitis C testing due to lack of risk factors.  He is also due for a tetanus shot but he politely declines this today.  We discussed the flu shot at length and the patient declines his flu shot as well.  Past medical history is significant for an abnormal EKG.  I reviewed his previous EKGs which shows diffuse T wave inversions primarily in the lateral leads.  He was referred to Dr. Rennis GoldenHilty who performed a stress test which revealed no ischemia and an ejection fraction of 55 to 60%.  This apparently is the patient's baseline EKG.  Past medical history is also significant for a small bowel obstruction in 2018.  He was hospitalized for 4 days and it spontaneously resolved.  He has had no further bowel obstruction since. At that time, my plan was: Blood pressure is outstanding.  I will make no changes in his hydrochlorothiazide or his losartan.  I offered the patient a flu shot and a tetanus shot however he politely declines.  If he changes his mind he can return at any time to get these.  Colonoscopy is not due for another 6 years.  Will screen for prostate cancer with a PSA.  Switch the patient's medicine to  Protonix 40 mg a day for reflux.  He can call me back if this medication does not work.  Check fasting lipid panel.  Goal LDL cholesterol is less than 100.  03/17/19 See lab results obtained 10/30.  His creatinine was found to have coincidentally increased to greater than 1.7 without any appreciable cause.  He denies any dysuria, urgency, frequency, or hematuria.  He denies any low back pain or abdominal pain or nausea or vomiting.  He did recently take a lot of NSAIDs however he has not been taking them over the last few weeks.  He also discontinued hydrochlorothiazide at my encouragement.  He admits that he does not drink very much water.  He denies any fevers or chills. Past Medical History:  Diagnosis Date  . Abnormal EKG   . Benign essential HTN   . SBO (small bowel obstruction) (HCC)    Past Surgical History:  Procedure Laterality Date  . VASECTOMY     Current Outpatient Medications on File Prior to Visit  Medication Sig Dispense Refill  . atorvastatin (LIPITOR) 20 MG tablet TAKE 1 TABLET BY MOUTH EVERY DAY (Patient not taking: Reported on 03/03/2019) 90 tablet 0  . fluticasone (FLONASE) 50 MCG/ACT nasal spray Place 2 sprays into both nostrils daily. 16 g 6  . hydrochlorothiazide (HYDRODIURIL) 25 MG tablet Take 1 tablet (25 mg total) by  mouth daily. 90 tablet 3  . losartan (COZAAR) 100 MG tablet Take 0.5 tablets (50 mg total) by mouth daily. 90 tablet 3  . pantoprazole (PROTONIX) 40 MG tablet Take 1 tablet (40 mg total) by mouth daily. 30 tablet 3   No current facility-administered medications on file prior to visit.    No Known Allergies Social History   Socioeconomic History  . Marital status: Divorced    Spouse name: Not on file  . Number of children: 2  . Years of education: Not on file  . Highest education level: Not on file  Occupational History  . Occupation: self-employed  Social Needs  . Financial resource strain: Not on file  . Food insecurity    Worry: Not on  file    Inability: Not on file  . Transportation needs    Medical: Not on file    Non-medical: Not on file  Tobacco Use  . Smoking status: Never Smoker  . Smokeless tobacco: Never Used  Substance and Sexual Activity  . Alcohol use: Yes    Comment: occassional, 6-10 beers on occasion  . Drug use: No  . Sexual activity: Not on file  Lifestyle  . Physical activity    Days per week: Not on file    Minutes per session: Not on file  . Stress: Not on file  Relationships  . Social Herbalist on phone: Not on file    Gets together: Not on file    Attends religious service: Not on file    Active member of club or organization: Not on file    Attends meetings of clubs or organizations: Not on file    Relationship status: Not on file  . Intimate partner violence    Fear of current or ex partner: Not on file    Emotionally abused: Not on file    Physically abused: Not on file    Forced sexual activity: Not on file  Other Topics Concern  . Not on file  Social History Narrative  . Not on file   Family History  Problem Relation Age of Onset  . Heart disease Mother   . Diabetes Mother   . Cancer Mother        lung/liver  . Bladder Cancer Father   . Heart disease Father   . Cancer Father        colon  . Colon cancer Neg Hx   . Stomach cancer Neg Hx   . Rectal cancer Neg Hx   . Liver cancer Neg Hx   . Esophageal cancer Neg Hx       Review of Systems  All other systems reviewed and are negative.      Objective:   Physical Exam Vitals signs reviewed.  Constitutional:      General: He is not in acute distress.    Appearance: He is well-developed. He is not diaphoretic.  HENT:     Head: Normocephalic and atraumatic.     Right Ear: External ear normal.     Left Ear: External ear normal.     Nose: Nose normal.     Mouth/Throat:     Pharynx: No oropharyngeal exudate.  Eyes:     General: No scleral icterus.       Right eye: No discharge.        Left eye: No  discharge.     Conjunctiva/sclera: Conjunctivae normal.     Pupils: Pupils are equal, round, and reactive  to light.  Neck:     Musculoskeletal: Normal range of motion and neck supple.     Thyroid: No thyromegaly.     Vascular: No JVD.     Trachea: No tracheal deviation.  Cardiovascular:     Rate and Rhythm: Normal rate and regular rhythm.     Heart sounds: Normal heart sounds. No murmur. No friction rub. No gallop.   Pulmonary:     Effort: Pulmonary effort is normal. No respiratory distress.     Breath sounds: Normal breath sounds. No stridor. No wheezing or rales.  Chest:     Chest wall: No tenderness.  Abdominal:     General: Bowel sounds are normal. There is no distension.     Palpations: Abdomen is soft. There is no mass.     Tenderness: There is no abdominal tenderness. There is no guarding or rebound.  Lymphadenopathy:     Cervical: No cervical adenopathy.  Skin:    General: Skin is warm and dry.  Neurological:     Mental Status: He is alert and oriented to person, place, and time.     Cranial Nerves: No cranial nerve deficit.     Sensory: No sensory deficit.     Motor: No abnormal muscle tone.     Coordination: Coordination normal.     Deep Tendon Reflexes: Reflexes normal.           Assessment & Plan:  Renal insufficiency - Plan: BASIC METABOLIC PANEL WITH GFR, Urinalysis, Routine w reflex microscopic, US Renal  Repeat BMP to see if creatinine is still elevated after holding hydrochlorothiazide.  If renal function is back to normal after holding hydrochlorothiazide, this would point to possible prerenal azotemia coupled with NSAID toxicity as a cause.  Obtain a urinalysis to evaluate for any significant proteinuria or hematuria that may suggest a glomerular cause or intrinsic renal disease.  If creatinine remains elevated, I would recommend a renal ultrasound to evaluate for any evidence of hydronephrosis or structural problems within the kidney.  Replace  hydrochlorothiazide with amlodipine 10 mg a day to try to keep blood pressure around 130/80

## 2019-03-18 LAB — URINALYSIS, ROUTINE W REFLEX MICROSCOPIC
Bilirubin Urine: NEGATIVE
Glucose, UA: NEGATIVE
Hgb urine dipstick: NEGATIVE
Ketones, ur: NEGATIVE
Leukocytes,Ua: NEGATIVE
Nitrite: NEGATIVE
Protein, ur: NEGATIVE
Specific Gravity, Urine: 1.019 (ref 1.001–1.03)
pH: 5.5 (ref 5.0–8.0)

## 2019-03-18 LAB — BASIC METABOLIC PANEL WITH GFR
BUN/Creatinine Ratio: 15 (calc) (ref 6–22)
BUN: 20 mg/dL (ref 7–25)
CO2: 21 mmol/L (ref 20–32)
Calcium: 9 mg/dL (ref 8.6–10.3)
Chloride: 109 mmol/L (ref 98–110)
Creat: 1.35 mg/dL — ABNORMAL HIGH (ref 0.70–1.25)
GFR, Est African American: 66 mL/min/{1.73_m2} (ref >=60)
GFR, Est Non African American: 57 mL/min/{1.73_m2} — ABNORMAL LOW (ref >=60)
Glucose, Bld: 86 mg/dL (ref 65–99)
Potassium: 4.3 mmol/L (ref 3.5–5.3)
Sodium: 138 mmol/L (ref 135–146)

## 2019-03-21 ENCOUNTER — Telehealth: Payer: Self-pay

## 2019-03-21 DIAGNOSIS — N289 Disorder of kidney and ureter, unspecified: Secondary | ICD-10-CM

## 2019-03-21 NOTE — Telephone Encounter (Signed)
Error

## 2019-03-27 ENCOUNTER — Ambulatory Visit (HOSPITAL_COMMUNITY)
Admission: RE | Admit: 2019-03-27 | Discharge: 2019-03-27 | Disposition: A | Discharge status: Home / Self Care | Payer: Self-pay | Source: Ambulatory Visit | Attending: Family Medicine | Admitting: Family Medicine

## 2019-03-27 ENCOUNTER — Other Ambulatory Visit: Payer: Self-pay

## 2019-03-27 DIAGNOSIS — N289 Disorder of kidney and ureter, unspecified: Secondary | ICD-10-CM | POA: Insufficient documentation

## 2019-05-30 ENCOUNTER — Other Ambulatory Visit: Payer: Self-pay | Admitting: Family Medicine

## 2019-12-14 ENCOUNTER — Other Ambulatory Visit: Payer: Self-pay

## 2019-12-14 ENCOUNTER — Ambulatory Visit: Payer: Self-pay | Admitting: Nurse Practitioner

## 2019-12-14 VITALS — BP 114/72 | HR 58 | Temp 98.2°F | Resp 18 | Wt 173.0 lb

## 2019-12-14 DIAGNOSIS — R197 Diarrhea, unspecified: Secondary | ICD-10-CM

## 2019-12-14 DIAGNOSIS — E860 Dehydration: Secondary | ICD-10-CM

## 2019-12-14 MED ORDER — DIPHENOXYLATE-ATROPINE 2.5-0.025 MG PO TABS: 1.0000 | ORAL_TABLET | Freq: Four times a day (QID) | ORAL | 0 refills | Status: DC | PRN

## 2019-12-14 NOTE — Progress Notes (Signed)
Established Patient Office Visit  Subjective:  Patient ID: Devon Riley, male    DOB: May 15, 1960  Age: 59 y.o. MRN: [id number]  CC:  Chief Complaint  Patient presents with  . Diarrhea    started on 08/09, stomach cramps, no meds    HPI Devon Riley is a 59 year old male that started as a telephone encounter this AM but after discussing his sxs felt he was dehydrated and required IV rehydration in clinic. On Sunday evening on his drive home from the beach he ate a peach from roadside fruit stand then within one hour his stomach began to cramp. Monday morning stomach began to cramp worse and then diarrhea began. Exacerbated with eating or drinking. He reports diarhea 3xs this am.  No fever/chills, nasal congestion/drainage, h/a, abdominal pain, vomitting, fatique, general body aches, loss of smell/taste. No others with similar sxs.   Colonoscopy 2016 no diverticula.   Past Medical History:  Diagnosis Date  . Abnormal EKG   . Benign essential HTN   . SBO (small bowel obstruction) (HCC)     Past Surgical History:  Procedure Laterality Date  . VASECTOMY      Family History  Problem Relation Age of Onset  . Heart disease Mother   . Diabetes Mother   . Cancer Mother        lung/liver  . Bladder Cancer Father   . Heart disease Father   . Cancer Father        colon  . Colon cancer Neg Hx   . Stomach cancer Neg Hx   . Rectal cancer Neg Hx   . Liver cancer Neg Hx   . Esophageal cancer Neg Hx     Social History   Socioeconomic History  . Marital status: Divorced    Spouse name: Not on file  . Number of children: 2  . Years of education: Not on file  . Highest education level: Not on file  Occupational History  . Occupation: self-employed  Tobacco Use  . Smoking status: Never Smoker  . Smokeless tobacco: Never Used  Substance and Sexual Activity  . Alcohol use: Yes    Comment: occassional, 6-10 beers on occasion  . Drug use: No  . Sexual activity: Not on file    Other Topics Concern  . Not on file  Social History Narrative  . Not on file   Social Determinants of Health   Financial Resource Strain:   . Difficulty of Paying Living Expenses:   Food Insecurity:   . Worried About Programme researcher, broadcasting/film/video in the Last Year:   . Barista in the Last Year:   Transportation Needs:   . Freight forwarder (Medical):   Marland Kitchen Lack of Transportation (Non-Medical):   Physical Activity:   . Days of Exercise per Week:   . Minutes of Exercise per Session:   Stress:   . Feeling of Stress :   Social Connections:   . Frequency of Communication with Friends and Family:   . Frequency of Social Gatherings with Friends and Family:   . Attends Religious Services:   . Active Member of Clubs or Organizations:   . Attends Banker Meetings:   Marland Kitchen Marital Status:   Intimate Partner Violence:   . Fear of Current or Ex-Partner:   . Emotionally Abused:   Marland Kitchen Physically Abused:   . Sexually Abused:     Outpatient Medications Prior to Visit  Medication Sig Dispense  Refill  . amLODipine (NORVASC) 10 MG tablet Take 1 tablet (10 mg total) by mouth daily. 90 tablet 3  . losartan (COZAAR) 100 MG tablet Take 0.5 tablets (50 mg total) by mouth daily. 90 tablet 3  . pantoprazole (PROTONIX) 40 MG tablet TAKE 1 TABLET BY MOUTH EVERY DAY 90 tablet 1  . fluticasone (FLONASE) 50 MCG/ACT nasal spray Place 2 sprays into both nostrils daily. 16 g 6   No facility-administered medications prior to visit.    No Known Allergies  ROS Review of Systems  All other systems reviewed and are negative.     Objective:    Physical Exam Vitals and nursing note reviewed.  Constitutional:      Appearance: Normal appearance.  HENT:     Head: Normocephalic.  Eyes:     Extraocular Movements: Extraocular movements intact.     Conjunctiva/sclera: Conjunctivae normal.     Pupils: Pupils are equal, round, and reactive to light.  Cardiovascular:     Rate and Rhythm: Normal  rate.     Pulses: Normal pulses.  Pulmonary:     Effort: Pulmonary effort is normal.  Abdominal:     General: Abdomen is flat.     Tenderness: There is no guarding.  Musculoskeletal:     Cervical back: Normal range of motion and neck supple.     Right lower leg: No edema.     Left lower leg: No edema.  Skin:    General: Skin is warm and dry.     Coloration: Skin is not jaundiced or pale.     Findings: No rash.  Neurological:     General: No focal deficit present.     Mental Status: He is alert and oriented to person, place, and time.  Psychiatric:        Attention and Perception: Attention and perception normal.        Mood and Affect: Mood and affect normal.        Speech: Speech normal.        Behavior: Behavior normal.        Thought Content: Thought content normal.        Judgment: Judgment normal.     BP 114/72 (BP Location: Left Arm, Patient Position: Sitting, Cuff Size: Normal)   Pulse (!) 58   Temp 98.2 F (36.8 C) (Temporal)   Resp 18   Wt 173 lb (78.5 kg)   SpO2 98%   BMI 27.10 kg/m  Wt Readings from Last 3 Encounters:  12/14/19 173 lb (78.5 kg)  03/17/19 200 lb (90.7 kg)  03/03/19 195 lb (88.5 kg)     Health Maintenance Due  Topic Date Due  . Hepatitis C Screening  Never done  . TETANUS/TDAP  Never done  . INFLUENZA VACCINE  12/03/2019  . COVID-19 Vaccine (2 - Pfizer 2-dose series) 12/18/2019    There are no preventive care reminders to display for this patient.  No results found for: TSH Lab Results  Component Value Date   WBC 9.6 03/03/2019   HGB 12.5 (L) 03/03/2019   HCT 36.4 (L) 03/03/2019   MCV 97.6 03/03/2019   PLT 255 03/03/2019   Lab Results  Component Value Date   NA 138 03/17/2019   K 4.3 03/17/2019   CO2 21 03/17/2019   GLUCOSE 86 03/17/2019   BUN 20 03/17/2019   CREATININE 1.35 (H) 03/17/2019   BILITOT 0.7 03/03/2019   ALKPHOS 42 08/11/2016   AST 22 03/03/2019   ALT  16 03/03/2019   PROT 7.5 03/03/2019   ALBUMIN 4.0  08/11/2016   CALCIUM 9.0 03/17/2019   ANIONGAP 8 08/13/2016   Lab Results  Component Value Date   CHOL 201 (H) 03/03/2019   Lab Results  Component Value Date   HDL 36 (L) 03/03/2019   Lab Results  Component Value Date   LDLCALC 127 (H) 03/03/2019   Lab Results  Component Value Date   TRIG 234 (H) 03/03/2019   Lab Results  Component Value Date   CHOLHDL 5.6 (H) 03/03/2019   No results found for: HGBA1C    Assessment & Plan:   Problem List Items Addressed This Visit    None    Visit Diagnoses    Diarrhea, unspecified type    -  Primary   Relevant Medications   diphenoxylate-atropine (LOMOTIL) 2.5-0.025 MG tablet   Other Relevant Orders   BASIC METABOLIC PANEL WITH GFR   Stool culture   Lipase     1 liter of NS was administered intravenous in clinic today for hydration.Feels better and stomach cramping significantly reduced.  Labs completed will call with abnormal. Stool culture collected.  Hydration is very important. Today clear liquid diet advance tomorrow to liquid diet. Next day may advance as tolerated to bland diet such as applesauce, toast, crackers, bananas.   You may take over the counter probiotic.   Take prescribed medication as directed   Meds ordered this encounter  Medications  . diphenoxylate-atropine (LOMOTIL) 2.5-0.025 MG tablet    Sig: Take 1 tablet by mouth 4 (four) times daily as needed for diarrhea or loose stools.    Dispense:  30 tablet    Refill:  0    Follow-up: Return if symptoms worsen or fail to improve.    Elmore Guise, FNP

## 2019-12-14 NOTE — Patient Instructions (Signed)
1 liter of NS was administered intravenous in clinic today for hydration. Labs completed will call with abnormal. Stool culture collected.  Hydration is very important. Today clear liquid diet advance tomorrow to liquid diet. Next day may advance as tolerated to bland diet such as applesauce, toast, crackers, bananas.   You may take over the counter probiotic.   Take prescribed medication as directed

## 2019-12-15 LAB — BASIC METABOLIC PANEL WITH GFR
BUN: 19 mg/dL (ref 7–25)
CO2: 21 mmol/L (ref 20–32)
Calcium: 8.8 mg/dL (ref 8.6–10.3)
Chloride: 107 mmol/L (ref 98–110)
Creat: 1.22 mg/dL (ref 0.70–1.25)
GFR, Est African American: 74 mL/min/{1.73_m2} (ref >=60)
GFR, Est Non African American: 64 mL/min/{1.73_m2} (ref >=60)
Glucose, Bld: 87 mg/dL (ref 65–99)
Potassium: 4.6 mmol/L (ref 3.5–5.3)
Sodium: 137 mmol/L (ref 135–146)

## 2019-12-15 LAB — LIPASE: Lipase: 34 U/L (ref 7–60)

## 2020-04-02 ENCOUNTER — Other Ambulatory Visit: Payer: Self-pay | Admitting: Family Medicine

## 2020-07-04 ENCOUNTER — Other Ambulatory Visit: Payer: Self-pay

## 2020-07-04 ENCOUNTER — Ambulatory Visit (INDEPENDENT_AMBULATORY_CARE_PROVIDER_SITE_OTHER): Payer: 59 | Admitting: Family Medicine

## 2020-07-04 VITALS — BP 122/60 | HR 73 | Temp 97.5°F | Resp 18 | Ht 67.0 in | Wt 178.0 lb

## 2020-07-04 DIAGNOSIS — K645 Perianal venous thrombosis: Secondary | ICD-10-CM

## 2020-07-04 MED ORDER — HYDROCORTISONE 2.5 % EX CREA: TOPICAL_CREAM | Freq: Three times a day (TID) | CUTANEOUS | 1 refills | Status: DC

## 2020-07-04 NOTE — Progress Notes (Signed)
Subjective:    Patient ID: Devon Riley, male    DOB: 01-Nov-1960, 60 y.o.   MRN: [id number]  Patient reports a 2-day history of pain around his rectum.  On examination today, he has a very large thrombosed external hemorrhoid.  Is probably 3 cm in diameter.  There is a 1 cm dark purple area of thrombosis around 1:00 in the external hemorrhoid.  The overlying tissue is erythematous and swollen and tender.  It has been bleeding. Past Medical History:  Diagnosis Date  . Abnormal EKG   . Benign essential HTN   . SBO (small bowel obstruction) (HCC)    Past Surgical History:  Procedure Laterality Date  . VASECTOMY     Current Outpatient Medications on File Prior to Visit  Medication Sig Dispense Refill  . amLODipine (NORVASC) 10 MG tablet TAKE 1 TABLET BY MOUTH EVERY DAY 90 tablet 3  . diphenoxylate-atropine (LOMOTIL) 2.5-0.025 MG tablet Take 1 tablet by mouth 4 (four) times daily as needed for diarrhea or loose stools. 30 tablet 0  . losartan (COZAAR) 100 MG tablet Take 0.5 tablets (50 mg total) by mouth daily. 90 tablet 3  . pantoprazole (PROTONIX) 40 MG tablet TAKE 1 TABLET BY MOUTH EVERY DAY 90 tablet 1   No current facility-administered medications on file prior to visit.   No Known Allergies Social History   Socioeconomic History  . Marital status: Divorced    Spouse name: Not on file  . Number of children: 2  . Years of education: Not on file  . Highest education level: Not on file  Occupational History  . Occupation: self-employed  Tobacco Use  . Smoking status: Never Smoker  . Smokeless tobacco: Never Used  Substance and Sexual Activity  . Alcohol use: Yes    Comment: occassional, 6-10 beers on occasion  . Drug use: No  . Sexual activity: Not on file  Other Topics Concern  . Not on file  Social History Narrative  . Not on file   Social Determinants of Health   Financial Resource Strain: Not on file  Food Insecurity: Not on file  Transportation Needs: Not on  file  Physical Activity: Not on file  Stress: Not on file  Social Connections: Not on file  Intimate Partner Violence: Not on file   Family History  Problem Relation Age of Onset  . Heart disease Mother   . Diabetes Mother   . Cancer Mother        lung/liver  . Bladder Cancer Father   . Heart disease Father   . Cancer Father        colon  . Colon cancer Neg Hx   . Stomach cancer Neg Hx   . Rectal cancer Neg Hx   . Liver cancer Neg Hx   . Esophageal cancer Neg Hx       Review of Systems  All other systems reviewed and are negative.      Objective:   Physical Exam Vitals reviewed.  Constitutional:      General: He is not in acute distress.    Appearance: He is well-developed. He is not diaphoretic.  HENT:     Head: Normocephalic and atraumatic.     Right Ear: External ear normal.     Left Ear: External ear normal.     Nose: Nose normal.     Mouth/Throat:     Pharynx: No oropharyngeal exudate.  Eyes:     General: No scleral icterus.  Right eye: No discharge.        Left eye: No discharge.     Conjunctiva/sclera: Conjunctivae normal.     Pupils: Pupils are equal, round, and reactive to light.  Neck:     Thyroid: No thyromegaly.     Vascular: No JVD.     Trachea: No tracheal deviation.  Cardiovascular:     Rate and Rhythm: Normal rate and regular rhythm.     Heart sounds: Normal heart sounds. No murmur heard. No friction rub. No gallop.   Pulmonary:     Effort: Pulmonary effort is normal. No respiratory distress.     Breath sounds: Normal breath sounds. No stridor. No wheezing or rales.  Chest:     Chest wall: No tenderness.  Abdominal:     General: Bowel sounds are normal. There is no distension.     Palpations: Abdomen is soft. There is no mass.     Tenderness: There is no abdominal tenderness. There is no guarding or rebound.  Genitourinary:    Rectum: Tenderness and external hemorrhoid present.    Musculoskeletal:     Cervical back: Normal  range of motion and neck supple.  Lymphadenopathy:     Cervical: No cervical adenopathy.  Skin:    General: Skin is warm and dry.  Neurological:     Mental Status: He is alert and oriented to person, place, and time.     Cranial Nerves: No cranial nerve deficit.     Sensory: No sensory deficit.     Motor: No abnormal muscle tone.     Coordination: Coordination normal.     Deep Tendon Reflexes: Reflexes normal.           Assessment & Plan:  External hemorrhoid, thrombosed  Patient has a thrombosed external hemorrhoid.  I offered the patient excision of the thrombosis.  I explained to him that we would numb the overlying tissue make a vertical incision with a scalpel and remove the thrombosis.  This would likely help with the pain and allow it to heal quicker.  However the patient would like to use topical creams instead.  Therefore I will start him on hydrocortisone 2.5% cream apply 3 times a day to the affected area.  I recommended MiraLAX 3 times a day to cause soft bowel movements.  I recommended that he wipe with Tucks pads after every bowel movement.  He can also use sitz bath's 2 or 3 times a day to help with swelling and pain and sit on a hemorrhoid pillow.  He can also apply Orajel to the affected area to help numb the hemorrhoid to ease the pain.

## 2020-07-08 ENCOUNTER — Ambulatory Visit: Payer: Self-pay | Admitting: Family Medicine

## 2020-08-08 ENCOUNTER — Encounter: Payer: 59 | Admitting: Family Medicine

## 2020-08-13 ENCOUNTER — Other Ambulatory Visit: Payer: Self-pay

## 2020-08-13 ENCOUNTER — Ambulatory Visit (INDEPENDENT_AMBULATORY_CARE_PROVIDER_SITE_OTHER): Payer: 59 | Admitting: Family Medicine

## 2020-08-13 VITALS — BP 120/64 | HR 96 | Temp 97.9°F | Resp 16 | Ht 67.0 in | Wt 186.0 lb

## 2020-08-13 DIAGNOSIS — I10 Essential (primary) hypertension: Secondary | ICD-10-CM

## 2020-08-13 DIAGNOSIS — Z125 Encounter for screening for malignant neoplasm of prostate: Secondary | ICD-10-CM

## 2020-08-13 DIAGNOSIS — E7800 Pure hypercholesterolemia, unspecified: Secondary | ICD-10-CM | POA: Diagnosis not present

## 2020-08-13 DIAGNOSIS — Z0000 Encounter for general adult medical examination without abnormal findings: Secondary | ICD-10-CM

## 2020-08-13 DIAGNOSIS — Z0001 Encounter for general adult medical examination with abnormal findings: Secondary | ICD-10-CM

## 2020-08-13 NOTE — Progress Notes (Signed)
Subjective:    Patient ID: Devon Riley, male    DOB: [birth year], 60 y.o.   MRN: [id number]  HPI Patient is a very pleasant 60 year old Caucasian male here today for complete physical exam.  He has noticed some bruising in the skin on the dorsums of both hands.  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  He has not seen any bruising anywhere else on the body.  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  He denies any night sweats fever or weight loss or melena or hematochezia or bruising on the core of his body.  Otherwise he is doing well.  His colonoscopy is up-to-date.  He is due for a PSA.  He is due for a booster on his COVID shot as well as the shingles vaccine and a tetanus shot.  He declines these today.  The remainder of his preventative care is up-to-date Past Medical History:  Diagnosis Date  . Abnormal EKG   . Benign essential HTN   . Hypertension    Phreesia 08/13/2020  . SBO (small bowel obstruction) (HCC)    Past Surgical History:  Procedure Laterality Date  . VASECTOMY     Current Outpatient Medications on File Prior to Visit  Medication Sig Dispense Refill  . amLODipine (NORVASC) 10 MG tablet TAKE 1 TABLET BY MOUTH EVERY DAY 90 tablet 3  . losartan (COZAAR) 100 MG tablet Take 0.5 tablets (50 mg total) by mouth daily. 90 tablet 3  . pantoprazole (PROTONIX) 40 MG tablet TAKE 1 TABLET BY MOUTH EVERY DAY (Patient not taking: Reported on 08/13/2020) 90 tablet 1   No current facility-administered medications on file prior to visit.   No Known Allergies Social History   Socioeconomic History  . Marital status: Divorced    Spouse name: Not on file  . Number of children: 2  . Years of education: Not on file  . Highest education level: Not on file  Occupational History  . Occupation: self-employed  Tobacco Use  . Smoking status: Never Smoker  . Smokeless tobacco: Never Used  Substance and Sexual Activity  . Alcohol  use: Yes    Comment: occassional, 6-10 beers on occasion  . Drug use: No  . Sexual activity: Not on file  Other Topics Concern  . Not on file  Social History Narrative  . Not on file   Social Determinants of Health   Financial Resource Strain: Not on file  Food Insecurity: Not on file  Transportation Needs: Not on file  Physical Activity: Not on file  Stress: Not on file  Social Connections: Not on file  Intimate Partner Violence: Not on file   Family History  Problem Relation Age of Onset  . Heart disease Mother   . Diabetes Mother   . Cancer Mother        lung/liver  . Bladder Cancer Father   . Heart disease Father   . Cancer Father        colon  . Colon cancer Neg Hx   . Stomach cancer Neg Hx   . Rectal cancer Neg Hx   . Liver cancer Neg Hx   . Esophageal cancer Neg Hx       Review of Systems  All other systems reviewed and are negative.      Objective:   Physical Exam Vitals reviewed.  Constitutional:      General: He is not in acute distress.  Appearance: He is well-developed. He is not diaphoretic.  HENT:     Head: Normocephalic and atraumatic.     Right Ear: External ear normal.     Left Ear: External ear normal.     Nose: Nose normal.     Mouth/Throat:     Pharynx: No oropharyngeal exudate.  Eyes:     General: No scleral icterus.       Right eye: No discharge.        Left eye: No discharge.     Conjunctiva/sclera: Conjunctivae normal.     Pupils: Pupils are equal, round, and reactive to light.  Neck:     Thyroid: No thyromegaly.     Vascular: No JVD.     Trachea: No tracheal deviation.  Cardiovascular:     Rate and Rhythm: Normal rate and regular rhythm.     Heart sounds: Normal heart sounds. No murmur heard. No friction rub. No gallop.   Pulmonary:     Effort: Pulmonary effort is normal. No respiratory distress.     Breath sounds: Normal breath sounds. No stridor. No wheezing or rales.  Chest:     Chest wall: No tenderness.   Abdominal:     General: Bowel sounds are normal. There is no distension.     Palpations: Abdomen is soft. There is no mass.     Tenderness: There is no abdominal tenderness. There is no guarding or rebound.  Musculoskeletal:     Cervical back: Normal range of motion and neck supple.  Lymphadenopathy:     Cervical: No cervical adenopathy.  Skin:    General: Skin is warm and dry.  Neurological:     Mental Status: He is alert and oriented to person, place, and time.     Cranial Nerves: No cranial nerve deficit.     Sensory: No sensory deficit.     Motor: No abnormal muscle tone.     Coordination: Coordination normal.     Deep Tendon Reflexes: Reflexes normal.           Assessment & Plan:  Prostate cancer screening  Pure hypercholesterolemia  Benign essential HTN  General medical exam  Physical exam today is completely normal.  Colonoscopy is up-to-date.  I asked the patient to return fasting so I can check a PSA for prostate cancer, fasting lipid panel, CMP, and a CBC.  Goal LDL cholesterol be less than 100.  Recommended a tetanus shot.  Recommended the shingles vaccine.  Recommended a booster on his COVID vaccination.  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  However I wonder rule out thrombocytopenia or other bone marrow abnormalities

## 2020-08-14 ENCOUNTER — Other Ambulatory Visit: Payer: 59

## 2020-08-14 DIAGNOSIS — I10 Essential (primary) hypertension: Secondary | ICD-10-CM

## 2020-08-14 DIAGNOSIS — E7800 Pure hypercholesterolemia, unspecified: Secondary | ICD-10-CM

## 2020-08-15 LAB — CBC WITH DIFFERENTIAL/PLATELET
Absolute Monocytes: 727 cells/uL (ref 200–950)
Basophils Absolute: 29 cells/uL (ref 0–200)
Basophils Relative: 0.4 %
Eosinophils Absolute: 72 cells/uL (ref 15–500)
Eosinophils Relative: 1 %
HCT: 39.2 % (ref 38.5–50.0)
Hemoglobin: 13.1 g/dL — ABNORMAL LOW (ref 13.2–17.1)
Lymphs Abs: 2268 cells/uL (ref 850–3900)
MCH: 32.1 pg (ref 27.0–33.0)
MCHC: 33.4 g/dL (ref 32.0–36.0)
MCV: 96.1 fL (ref 80.0–100.0)
MPV: 10.9 fL (ref 7.5–12.5)
Monocytes Relative: 10.1 %
Neutro Abs: 4104 cells/uL (ref 1500–7800)
Neutrophils Relative %: 57 %
Platelets: 212 10*3/uL (ref 140–400)
RBC: 4.08 10*6/uL — ABNORMAL LOW (ref 4.20–5.80)
RDW: 12.3 % (ref 11.0–15.0)
Total Lymphocyte: 31.5 %
WBC: 7.2 10*3/uL (ref 3.8–10.8)

## 2020-08-15 LAB — COMPLETE METABOLIC PANEL WITH GFR
AG Ratio: 1.5 (calc) (ref 1.0–2.5)
ALT: 19 U/L (ref 9–46)
AST: 21 U/L (ref 10–35)
Albumin: 4.3 g/dL (ref 3.6–5.1)
Alkaline phosphatase (APISO): 49 U/L (ref 35–144)
BUN: 20 mg/dL (ref 7–25)
CO2: 25 mmol/L (ref 20–32)
Calcium: 9.3 mg/dL (ref 8.6–10.3)
Chloride: 106 mmol/L (ref 98–110)
Creat: 1.19 mg/dL (ref 0.70–1.25)
GFR, Est African American: 76 mL/min/{1.73_m2} (ref >=60)
GFR, Est Non African American: 66 mL/min/{1.73_m2} (ref >=60)
Globulin: 2.9 g/dL (calc) (ref 1.9–3.7)
Glucose, Bld: 104 mg/dL — ABNORMAL HIGH (ref 65–99)
Potassium: 4.9 mmol/L (ref 3.5–5.3)
Sodium: 138 mmol/L (ref 135–146)
Total Bilirubin: 0.7 mg/dL (ref 0.2–1.2)
Total Protein: 7.2 g/dL (ref 6.1–8.1)

## 2020-08-15 LAB — LIPID PANEL
Cholesterol: 198 mg/dL (ref <200)
HDL: 53 mg/dL (ref >=40)
LDL Cholesterol (Calc): 126 mg/dL (calc) — ABNORMAL HIGH
Non-HDL Cholesterol (Calc): 145 mg/dL (calc) — ABNORMAL HIGH (ref <130)
Total CHOL/HDL Ratio: 3.7 (calc) (ref <5.0)
Triglycerides: 86 mg/dL (ref <150)

## 2020-08-20 ENCOUNTER — Other Ambulatory Visit: Payer: Self-pay | Admitting: *Deleted

## 2020-08-20 MED ORDER — ATORVASTATIN CALCIUM 10 MG PO TABS: 10.0000 mg | ORAL_TABLET | Freq: Every day | ORAL | 3 refills | Status: DC

## 2021-04-02 ENCOUNTER — Encounter: Payer: Self-pay | Admitting: Orthopaedic Surgery

## 2021-04-02 ENCOUNTER — Ambulatory Visit (INDEPENDENT_AMBULATORY_CARE_PROVIDER_SITE_OTHER): Payer: 59 | Admitting: Orthopaedic Surgery

## 2021-04-02 ENCOUNTER — Ambulatory Visit (INDEPENDENT_AMBULATORY_CARE_PROVIDER_SITE_OTHER): Payer: 59

## 2021-04-02 ENCOUNTER — Other Ambulatory Visit: Payer: Self-pay

## 2021-04-02 DIAGNOSIS — M25562 Pain in left knee: Secondary | ICD-10-CM | POA: Diagnosis not present

## 2021-04-02 DIAGNOSIS — G8929 Other chronic pain: Secondary | ICD-10-CM | POA: Diagnosis not present

## 2021-04-02 MED ORDER — MELOXICAM 7.5 MG PO TABS: 7.5000 mg | ORAL_TABLET | Freq: Two times a day (BID) | ORAL | 2 refills | Status: DC | PRN

## 2021-04-02 NOTE — Progress Notes (Signed)
Office Visit Note   Patient: Devon Riley           Date of Birth: [birth year]           MRN: [id number] Visit Date: 04/02/2021              Requested by: Donita Brooks, MD [id number] [facility] [address] [place],  [place] [address] PCP: Donita Brooks, MD   Assessment & Plan: Visit Diagnoses:  1. Chronic pain of left knee     Plan: Impression is left knee effusion.  He politely declined aspiration cortisone injection today and would rather start with some medication and rest and compressive wrap.  Prescription for meloxicam.  Follow-up as needed.  Follow-Up Instructions: Return if symptoms worsen or fail to improve.   Orders:  Orders Placed This Encounter  Procedures   XR KNEE 3 VIEW LEFT   Meds ordered this encounter  Medications   meloxicam (MOBIC) 7.5 MG tablet    Sig: Take 1 tablet (7.5 mg total) by mouth 2 (two) times daily as needed for pain.    Dispense:  30 tablet    Refill:  2      Procedures: No procedures performed   Clinical Data: No additional findings.   Subjective: Chief Complaint  Patient presents with   Left Knee - Pain    Devon Riley is a very pleasant 60 year old gentleman who comes in for injury to the left knee this past Friday.  Not exactly sure the mechanism may have hit it against the wall but then got much worse after he crawled on his knees for work.  Denies any mechanical symptoms or fevers or chills or gout.  He is limping due to the discomfort.  Feels like he may have overdone it.   Review of Systems  Constitutional: Negative.   All other systems reviewed and are negative.   Objective: Vital Signs: There were no vitals taken for this visit.  Physical Exam Vitals and nursing note reviewed.  Constitutional:      Appearance: He is well-developed.  Pulmonary:     Effort: Pulmonary effort is normal.  Abdominal:     Palpations: Abdomen is soft.  Skin:    General: Skin is warm.  Neurological:     Mental Status: He is alert and  oriented to person, place, and time.  Psychiatric:        Behavior: Behavior normal.        Thought Content: Thought content normal.        Judgment: Judgment normal.    Ortho Exam  Left knee shows a large joint effusion.  No significant warmth or evidence of infection.  Range of motion is limited secondary to the discomfort from the effusion.  No joint line tenderness.  Collaterals and cruciates are stable.  Specialty Comments:  No specialty comments available.  Imaging: XR KNEE 3 VIEW LEFT  Result Date: 04/02/2021 Super patellar effusion.  No acute abnormalities.    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   Abnormal EKG 01/07/2017   Chest pain 01/07/2017   Murmur 01/07/2017   Bradycardia 08/12/2016   Gastroenteritis 08/12/2016   Hypertension 05/27/2016   Past Medical History:  Diagnosis Date   Abnormal EKG    Benign essential HTN    Hypertension    Phreesia 08/13/2020   SBO (small bowel obstruction) (HCC)  Family History  Problem Relation Age of Onset   Heart disease Mother    Diabetes Mother    Cancer Mother        lung/liver   Bladder Cancer Father    Heart disease Father    Cancer Father        colon   Colon cancer Neg Hx    Stomach cancer Neg Hx    Rectal cancer Neg Hx    Liver cancer Neg Hx    Esophageal cancer Neg Hx     Past Surgical History:  Procedure Laterality Date   VASECTOMY     Social History   Occupational History   Occupation: self-employed  Tobacco Use   Smoking status: Never   Smokeless tobacco: Never  Substance and Sexual Activity   Alcohol use: Yes    Comment: occassional, 6-10 beers on occasion   Drug use: No   Sexual activity: Not on file

## 2021-04-29 MED ORDER — LOSARTAN POTASSIUM 100 MG PO TABS: 50.0000 mg | ORAL_TABLET | Freq: Every day | ORAL | 1 refills | Status: DC

## 2021-04-29 NOTE — Addendum Note (Signed)
Addended by: Grier Rocher on: 04/29/2021 12:40 PM   Modules accepted: Orders

## 2021-05-06 ENCOUNTER — Other Ambulatory Visit: Payer: Self-pay

## 2021-05-06 MED ORDER — AMLODIPINE BESYLATE 10 MG PO TABS: 10.0000 mg | ORAL_TABLET | Freq: Every day | ORAL | 0 refills | Status: DC

## 2021-08-07 ENCOUNTER — Other Ambulatory Visit: Payer: Self-pay | Admitting: Family Medicine

## 2021-08-29 ENCOUNTER — Ambulatory Visit (INDEPENDENT_AMBULATORY_CARE_PROVIDER_SITE_OTHER): Payer: PRIVATE HEALTH INSURANCE | Admitting: Family Medicine

## 2021-08-29 ENCOUNTER — Encounter: Payer: Self-pay | Admitting: Family Medicine

## 2021-08-29 VITALS — BP 116/70 | HR 59 | Temp 98.7°F | Ht 67.0 in | Wt 183.4 lb

## 2021-08-29 DIAGNOSIS — I10 Essential (primary) hypertension: Secondary | ICD-10-CM | POA: Diagnosis not present

## 2021-08-29 DIAGNOSIS — E7800 Pure hypercholesterolemia, unspecified: Secondary | ICD-10-CM | POA: Diagnosis not present

## 2021-08-29 DIAGNOSIS — Z0000 Encounter for general adult medical examination without abnormal findings: Secondary | ICD-10-CM

## 2021-08-29 DIAGNOSIS — R0989 Other specified symptoms and signs involving the circulatory and respiratory systems: Secondary | ICD-10-CM

## 2021-08-29 DIAGNOSIS — Z125 Encounter for screening for malignant neoplasm of prostate: Secondary | ICD-10-CM | POA: Diagnosis not present

## 2021-08-29 DIAGNOSIS — R5383 Other fatigue: Secondary | ICD-10-CM

## 2021-08-29 LAB — CBC WITH DIFFERENTIAL/PLATELET
Absolute Monocytes: 631 cells/uL (ref 200–950)
Basophils Absolute: 42 cells/uL (ref 0–200)
Basophils Relative: 0.5 %
Eosinophils Absolute: 42 cells/uL (ref 15–500)
Eosinophils Relative: 0.5 %
HCT: 36.9 % — ABNORMAL LOW (ref 38.5–50.0)
Hemoglobin: 12.6 g/dL — ABNORMAL LOW (ref 13.2–17.1)
Lymphs Abs: 2266 cells/uL (ref 850–3900)
MCH: 32.2 pg (ref 27.0–33.0)
MCHC: 34.1 g/dL (ref 32.0–36.0)
MCV: 94.4 fL (ref 80.0–100.0)
MPV: 10.9 fL (ref 7.5–12.5)
Monocytes Relative: 7.6 %
Neutro Abs: 5320 cells/uL (ref 1500–7800)
Neutrophils Relative %: 64.1 %
Platelets: 266 10*3/uL (ref 140–400)
RBC: 3.91 10*6/uL — ABNORMAL LOW (ref 4.20–5.80)
RDW: 12.6 % (ref 11.0–15.0)
Total Lymphocyte: 27.3 %
WBC: 8.3 10*3/uL (ref 3.8–10.8)

## 2021-08-29 NOTE — Progress Notes (Signed)
? ?Subjective:  ? ? Patient ID: Devon Riley, male    DOB: 08-Dec-1960, 61 y.o.   MRN: [id number] ? ?HPI ?Patient is a very pleasant 61 year old Caucasian male here today for complete physical exam.  Last colonoscopy was in 2016 and was normal. Due again in 2026.  Due for shingrix.  Patient reports severe fatigue.  He reports erectile dysfunction.  He reports poor libido.  He reports trouble sleeping.  He snores.  His wife has heard him gasping for air at night.  He always feels tired.  He never feels rested.  He denies any chest pain or shortness of breath or dyspnea on exertion or pleurisy or hemoptysis.  He denies any recent weight changes or heat intolerance or palpitations. ?Immunization History  ?Administered Date(s) Administered  ? PFIZER(Purple Top)SARS-COV-2 Vaccination 11/27/2019, 12/18/2019  ? ? ?Past Medical History:  ?Diagnosis Date  ? Abnormal EKG   ? Benign essential HTN   ? Hypertension   ? Phreesia 08/13/2020  ? SBO (small bowel obstruction) (HCC)   ? ?Past Surgical History:  ?Procedure Laterality Date  ? VASECTOMY    ? ?Current Outpatient Medications on File Prior to Visit  ?Medication Sig Dispense Refill  ? amLODipine (NORVASC) 10 MG tablet Take 1 tablet (10 mg total) by mouth daily. PT NEEDS OV FOR FURTHER REFILLS 30 tablet 0  ? atorvastatin (LIPITOR) 10 MG tablet Take 1 tablet (10 mg total) by mouth daily. 90 tablet 3  ? losartan (COZAAR) 100 MG tablet Take 0.5 tablets (50 mg total) by mouth daily. 90 tablet 1  ? meloxicam (MOBIC) 7.5 MG tablet Take 1 tablet (7.5 mg total) by mouth 2 (two) times daily as needed for pain. 30 tablet 2  ? pantoprazole (PROTONIX) 40 MG tablet TAKE 1 TABLET BY MOUTH EVERY DAY (Patient not taking: Reported on 08/13/2020) 90 tablet 1  ? ?No current facility-administered medications on file prior to visit.  ? ?No Known Allergies ?Social History  ? ?Socioeconomic History  ? Marital status: Divorced  ?  Spouse name: Not on file  ? Number of children: 2  ? Years of  education: Not on file  ? Highest education level: Not on file  ?Occupational History  ? Occupation: self-employed  ?Tobacco Use  ? Smoking status: Never  ? Smokeless tobacco: Never  ?Substance and Sexual Activity  ? Alcohol use: Yes  ?  Comment: occassional, 6-10 beers on occasion  ? Drug use: No  ? Sexual activity: Not on file  ?Other Topics Concern  ? Not on file  ?Social History Narrative  ? Not on file  ? ?Social Determinants of Health  ? ?Financial Resource Strain: Not on file  ?Food Insecurity: Not on file  ?Transportation Needs: Not on file  ?Physical Activity: Not on file  ?Stress: Not on file  ?Social Connections: Not on file  ?Intimate Partner Violence: Not on file  ? ?Family History  ?Problem Relation Age of Onset  ? Heart disease Mother   ? Diabetes Mother   ? Cancer Mother   ?     lung/liver  ? Bladder Cancer Father   ? Heart disease Father   ? Cancer Father   ?     colon  ? Colon cancer Neg Hx   ? Stomach cancer Neg Hx   ? Rectal cancer Neg Hx   ? Liver cancer Neg Hx   ? Esophageal cancer Neg Hx   ? ? ? ? ?Review of Systems  ?All other systems  reviewed and are negative. ? ?   ?Objective:  ? Physical Exam ?Vitals reviewed.  ?Constitutional:   ?   General: He is not in acute distress. ?   Appearance: He is well-developed. He is not diaphoretic.  ?HENT:  ?   Head: Normocephalic and atraumatic.  ?   Right Ear: External ear normal.  ?   Left Ear: External ear normal.  ?   Nose: Nose normal.  ?   Mouth/Throat:  ?   Pharynx: No oropharyngeal exudate.  ?Eyes:  ?   General: No scleral icterus.    ?   Right eye: No discharge.     ?   Left eye: No discharge.  ?   Conjunctiva/sclera: Conjunctivae normal.  ?   Pupils: Pupils are equal, round, and reactive to light.  ?Neck:  ?   Thyroid: No thyromegaly.  ?   Vascular: No JVD.  ?   Trachea: No tracheal deviation.  ?Cardiovascular:  ?   Rate and Rhythm: Normal rate and regular rhythm.  ?   Heart sounds: Normal heart sounds. No murmur heard. ?  No friction rub. No  gallop.  ?Pulmonary:  ?   Effort: Pulmonary effort is normal. No respiratory distress.  ?   Breath sounds: Normal breath sounds. No stridor. No wheezing or rales.  ?Chest:  ?   Chest wall: No tenderness.  ?Abdominal:  ?   General: Bowel sounds are normal. There is no distension.  ?   Palpations: Abdomen is soft. There is no mass.  ?   Tenderness: There is no abdominal tenderness. There is no guarding or rebound.  ?Musculoskeletal:  ?   Cervical back: Normal range of motion and neck supple.  ?Lymphadenopathy:  ?   Cervical: No cervical adenopathy.  ?Skin: ?   General: Skin is warm and dry.  ?Neurological:  ?   Mental Status: He is alert and oriented to person, place, and time.  ?   Cranial Nerves: No cranial nerve deficit.  ?   Sensory: No sensory deficit.  ?   Motor: No abnormal muscle tone.  ?   Coordination: Coordination normal.  ?   Deep Tendon Reflexes: Reflexes normal.  ? ? ?Possible abdominal bruit auscultated on exam.  Patient reports that his 3 brothers also had AAA's. ? ? ?   ?Assessment & Plan:  ?General medical exam ? ?Prostate cancer screening - Plan: PSA ? ?Pure hypercholesterolemia - Plan: CBC with Differential/Platelet, Lipid panel, COMPLETE METABOLIC PANEL WITH GFR ? ?Benign essential HTN - Plan: CBC with Differential/Platelet, Lipid panel, COMPLETE METABOLIC PANEL WITH GFR ? ?Fatigue, unspecified type - Plan: Vitamin B12, TSH, Testosterone Total,Free,Bio, Males ? ?Abdominal bruit - Plan: VAS [place] AAA DUPLEX ?I suspect that the cause of his fatigue is most likely obstructive sleep apnea.  I recommended a sleep study.  However I will check a CBC CMP TSH B12 and testosterone level to rule out other metabolic causes.  Check a fasting lipid panel.  Goal LDL cholesterol is less than 100.  Check a PSA to screen for prostate cancer.  If his lab work is normal I would recommend a sleep study.  Also appreciated an abdominal bruit.  He has a strong family history of AAA's.  Therefore I will schedule the patient  an ultrasound to evaluate further ?

## 2021-08-30 LAB — LIPID PANEL
Cholesterol: 153 mg/dL (ref <200)
HDL: 49 mg/dL (ref >=40)
LDL Cholesterol (Calc): 86 mg/dL (calc)
Non-HDL Cholesterol (Calc): 104 mg/dL (calc) (ref <130)
Total CHOL/HDL Ratio: 3.1 (calc) (ref <5.0)
Triglycerides: 89 mg/dL (ref <150)

## 2021-08-30 LAB — COMPLETE METABOLIC PANEL WITH GFR
AG Ratio: 1.6 (calc) (ref 1.0–2.5)
ALT: 18 U/L (ref 9–46)
AST: 18 U/L (ref 10–35)
Albumin: 4.5 g/dL (ref 3.6–5.1)
Alkaline phosphatase (APISO): 53 U/L (ref 35–144)
BUN: 17 mg/dL (ref 7–25)
CO2: 24 mmol/L (ref 20–32)
Calcium: 9.5 mg/dL (ref 8.6–10.3)
Chloride: 108 mmol/L (ref 98–110)
Creat: 1 mg/dL (ref 0.70–1.35)
Globulin: 2.9 g/dL (calc) (ref 1.9–3.7)
Glucose, Bld: 98 mg/dL (ref 65–99)
Potassium: 4.7 mmol/L (ref 3.5–5.3)
Sodium: 139 mmol/L (ref 135–146)
Total Bilirubin: 0.8 mg/dL (ref 0.2–1.2)
Total Protein: 7.4 g/dL (ref 6.1–8.1)
eGFR: 85 mL/min/{1.73_m2} (ref >=60)

## 2021-08-30 LAB — TESTOSTERONE TOTAL,FREE,BIO, MALES
Albumin: 4.5 g/dL (ref 3.6–5.1)
Sex Hormone Binding: 32 nmol/L (ref 22–77)
Testosterone, Bioavailable: 114.1 ng/dL (ref 110.0–575.0)
Testosterone, Free: 55.5 pg/mL (ref 46.0–224.0)
Testosterone: 412 ng/dL (ref 250–827)

## 2021-08-30 LAB — VITAMIN B12: Vitamin B-12: 325 pg/mL (ref 200–1100)

## 2021-08-30 LAB — TSH: TSH: 2.82 mIU/L (ref 0.40–4.50)

## 2021-08-30 LAB — PSA: PSA: 0.76 ng/mL (ref <=4.00)

## 2021-09-01 ENCOUNTER — Other Ambulatory Visit: Payer: Self-pay

## 2021-09-01 DIAGNOSIS — G4730 Sleep apnea, unspecified: Secondary | ICD-10-CM

## 2021-09-11 ENCOUNTER — Other Ambulatory Visit: Payer: Self-pay | Admitting: Family Medicine

## 2021-09-11 MED ORDER — ATORVASTATIN CALCIUM 10 MG PO TABS: 10.0000 mg | ORAL_TABLET | Freq: Every day | ORAL | 1 refills | Status: DC

## 2021-09-11 NOTE — Telephone Encounter (Signed)
Requested Prescriptions  ?Pending Prescriptions Disp Refills  ?? amLODipine (NORVASC) 10 MG tablet [Pharmacy Med Name: AMLODIPINE BESYLATE 10 MG TAB] 90 tablet 1  ?  Sig: TAKE 1 TABLET (10 MG TOTAL) BY MOUTH DAILY. PT NEEDS OV FOR FURTHER REFILLS  ?  ? Cardiovascular: Calcium Channel Blockers 2 Failed - 09/11/2021  2:34 PM  ?  ?  Failed - Valid encounter within last 6 months  ?  Recent Outpatient Visits   ?      ? 1 week ago General medical exam  ? [facility] Donita Brooks, MD  ? 1 year ago Prostate cancer screening  ? [facility] Pickard, Priscille Heidelberg, MD  ? 1 year ago External hemorrhoid, thrombosed  ? [facility] Pickard, Priscille Heidelberg, MD  ? 1 year ago Diarrhea, unspecified type  ? [facility] Jenne Pane, Crystal A, FNP  ? 2 years ago Renal insufficiency  ? [facility] Pickard, Priscille Heidelberg, MD  ?  ?  ? ?  ?  ?  Passed - Last BP in normal range  ?  BP Readings from Last 1 Encounters:  ?08/29/21 116/70  ?   ?  ?  Passed - Last Heart Rate in normal range  ?  Pulse Readings from Last 1 Encounters:  ?08/29/21 (!) 59  ?   ?  ?  ? ? ?

## 2021-09-11 NOTE — Telephone Encounter (Signed)
[facility] [address] requesting a refill on atorvastatin (LIPITOR) 10 MG tablet [[id number]]  ?  Order Details ?Dose: 10 mg Route: Oral Frequency: Daily  ?Dispense Quantity: 90 tablet Refills: 3   ?     ?Sig: Take 1 tablet (10 mg total) by mouth daily.  ?     ?Start Date: 08/20/20 End Date: --  ?Written Date: 08/20/20 Expiration Date: 08/20/21  ?Providers ? ?Ordering and Authorizing Provider:   ?Donita Brooks, MD  ?[address] [place] [address]  ?Phone:  [phone]   Fax:  [phone]  ?DEA #:  [id number]   NPI:  [id number]   ?   ?Ordering User:  Six, Durwin Nora, LPN   ?   ? ?Pharmacy ? ?[id number] [place], [place] - [address]   ? ?

## 2021-09-11 NOTE — Telephone Encounter (Signed)
Requested Prescriptions  ?Pending Prescriptions Disp Refills  ?? atorvastatin (LIPITOR) 10 MG tablet 90 tablet 3  ?  Sig: Take 1 tablet (10 mg total) by mouth daily.  ?  ? Cardiovascular:  Antilipid - Statins Failed - 09/11/2021 11:08 AM  ?  ?  Failed - Valid encounter within last 12 months  ?  Recent Outpatient Visits   ?      ? 1 week ago General medical exam  ? [facility] Donita Brooks, MD  ? 1 year ago Prostate cancer screening  ? [facility] Pickard, Priscille Heidelberg, MD  ? 1 year ago External hemorrhoid, thrombosed  ? [facility] Pickard, Priscille Heidelberg, MD  ? 1 year ago Diarrhea, unspecified type  ? [facility] Jenne Pane, Crystal A, FNP  ? 2 years ago Renal insufficiency  ? [facility] Pickard, Priscille Heidelberg, MD  ?  ?  ? ?  ?  ?  Failed - Lipid Panel in normal range within the last 12 months  ?  Cholesterol  ?Date Value Ref Range Status  ?08/29/2021 153 <200 mg/dL Final  ? ?LDL Cholesterol (Calc)  ?Date Value Ref Range Status  ?08/29/2021 86 mg/dL (calc) Final  ?  Comment:  ?  Reference range: <100 ?Marland Kitchen ?Desirable range <100 mg/dL for primary prevention;   ?<70 mg/dL for patients with CHD or diabetic patients  ?with > or = 2 CHD risk factors. ?. ?LDL-C is now calculated using the Martin-Hopkins  ?calculation, which is a validated novel method providing  ?better accuracy than the Friedewald equation in the  ?estimation of LDL-C.  ?Horald Pollen et al. Lenox Ahr. 1610;960(45): 2061-2068  ?(http://education.[employer].com/faq/FAQ164) ?  ? ?HDL  ?Date Value Ref Range Status  ?08/29/2021 49 > OR = 40 mg/dL Final  ? ?Triglycerides  ?Date Value Ref Range Status  ?08/29/2021 89 <150 mg/dL Final  ? ?  ?  ?  Passed - Patient is not pregnant  ?  ?  ? ? ?

## 2021-10-09 ENCOUNTER — Ambulatory Visit (HOSPITAL_COMMUNITY)
Admission: RE | Admit: 2021-10-09 | Discharge: 2021-10-09 | Disposition: A | Discharge status: Home / Self Care | Payer: PRIVATE HEALTH INSURANCE | Source: Ambulatory Visit | Attending: Family Medicine | Admitting: Family Medicine

## 2021-10-09 DIAGNOSIS — R0989 Other specified symptoms and signs involving the circulatory and respiratory systems: Secondary | ICD-10-CM

## 2021-10-13 ENCOUNTER — Telehealth: Payer: Self-pay

## 2021-10-13 NOTE — Telephone Encounter (Signed)
-----   Message from Donita Brooks, MD sent at 10/12/2021  4:07 PM EDT ----- No sign of aneurysm in abdomen.

## 2021-10-13 NOTE — Telephone Encounter (Signed)
I have attempted without success to contact this patient by phone to discuss lab results. Lvm for pt to return call.

## 2021-10-25 IMAGING — US US RENAL
1 series · 14 of 25 positions shown · non-contrast
Comparison: CT Abdomen and Pelvis 08/11/2016.

CLINICAL DATA: 58-year-old male with renal insufficiency.

EXAM:
RENAL / URINARY TRACT ULTRASOUND COMPLETE

[Series 1: us renal · 0.25mm/px · 14 of 67 slices shown]
[im 1/67]
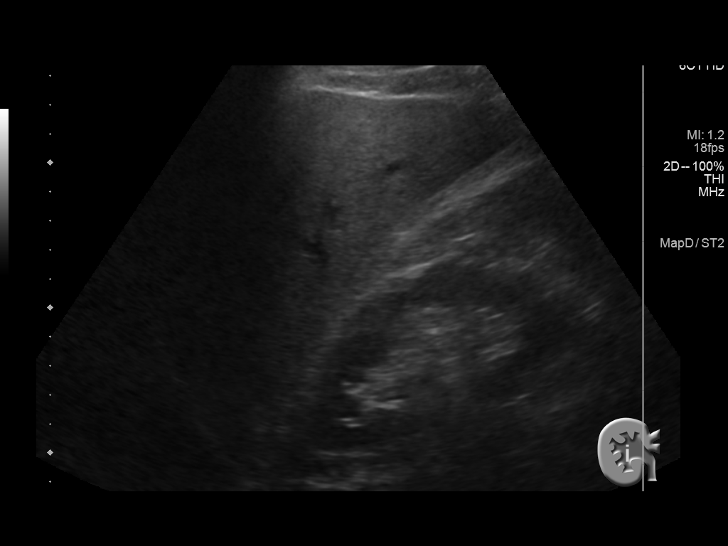
[im 6/67]
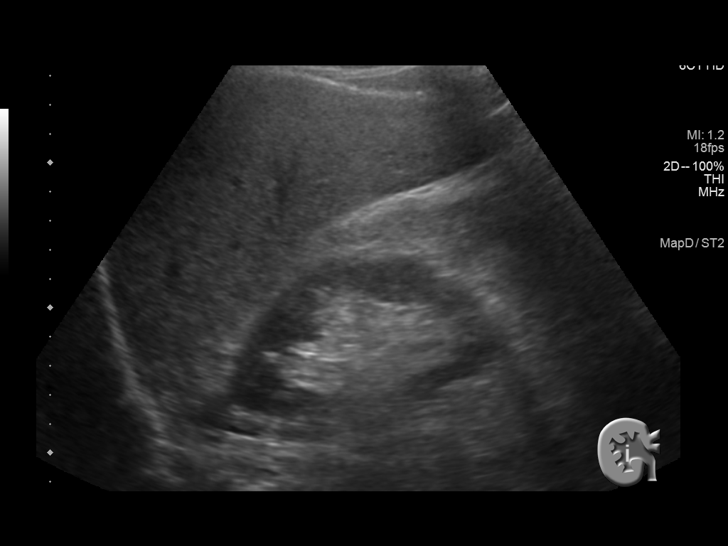
[im 12/67]
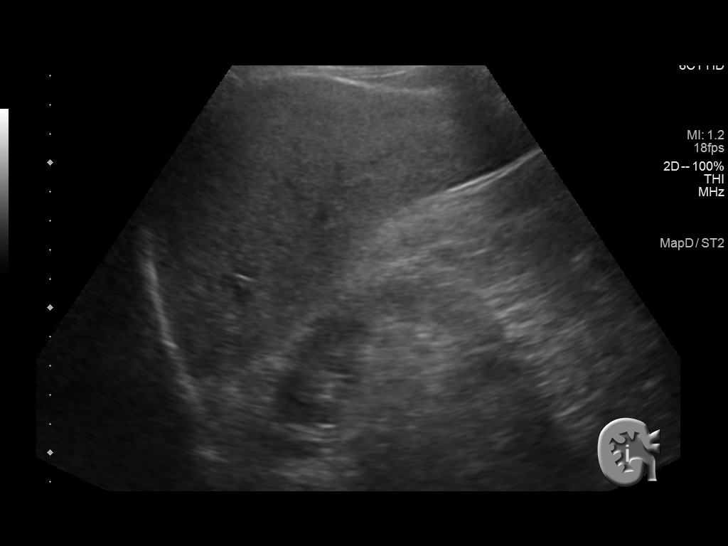
[im 17/67]
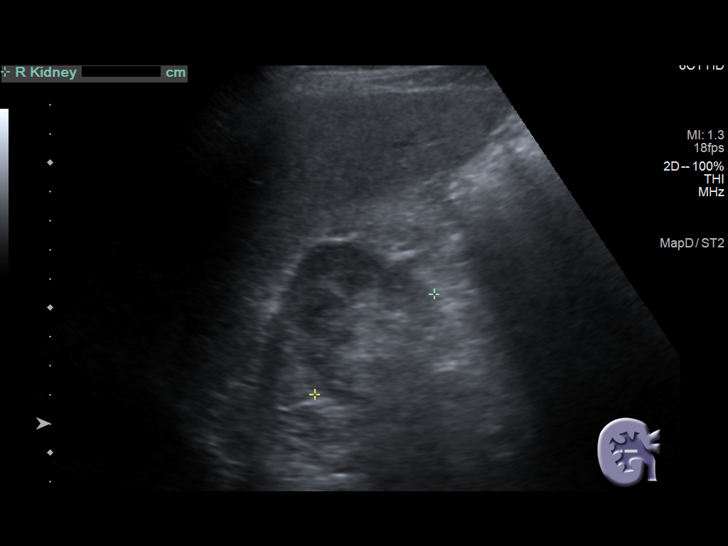
[im 23/67]
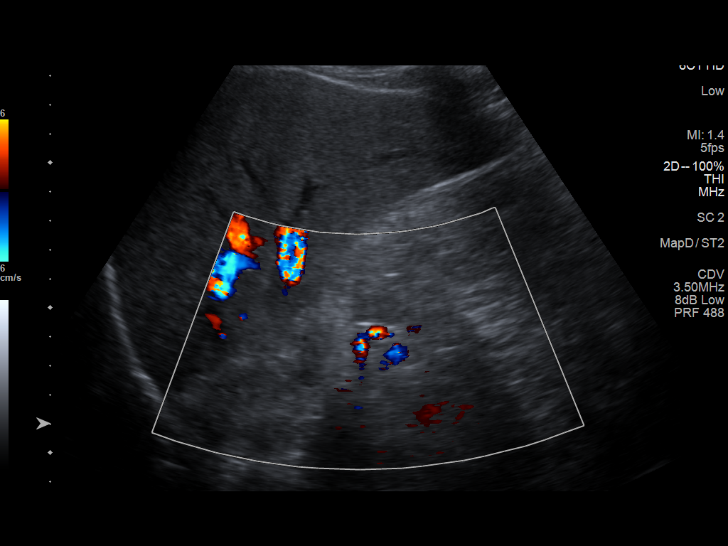
[im 25/67]
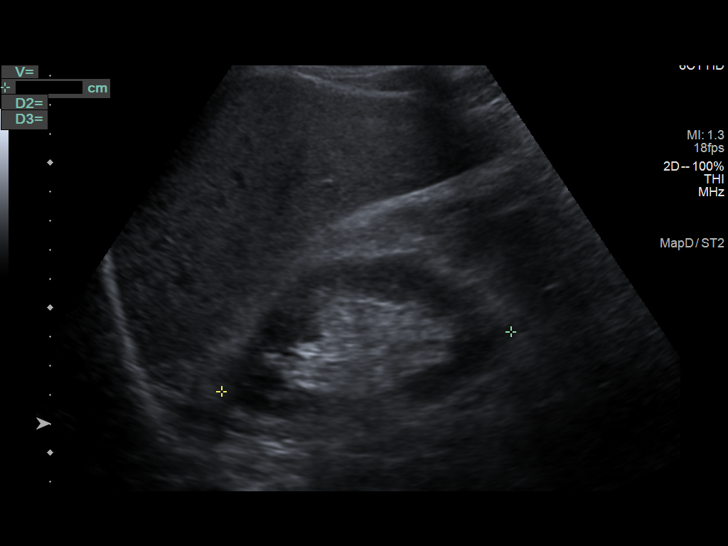
[im 31/67]
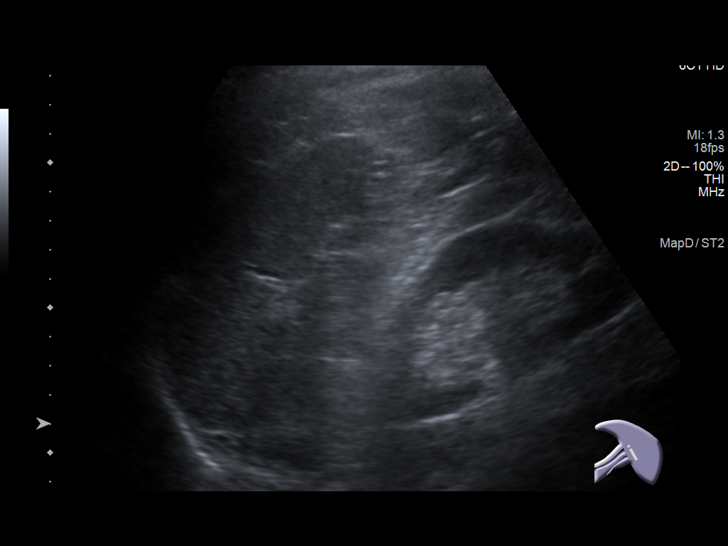
[im 36/67]
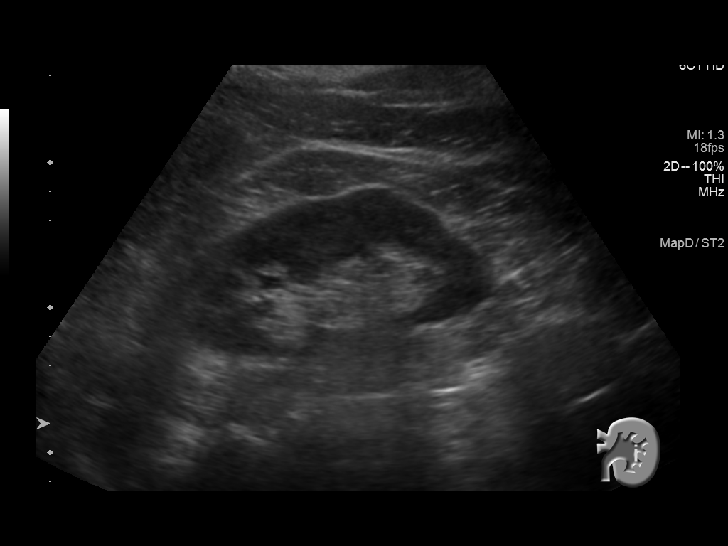
[im 42/67]
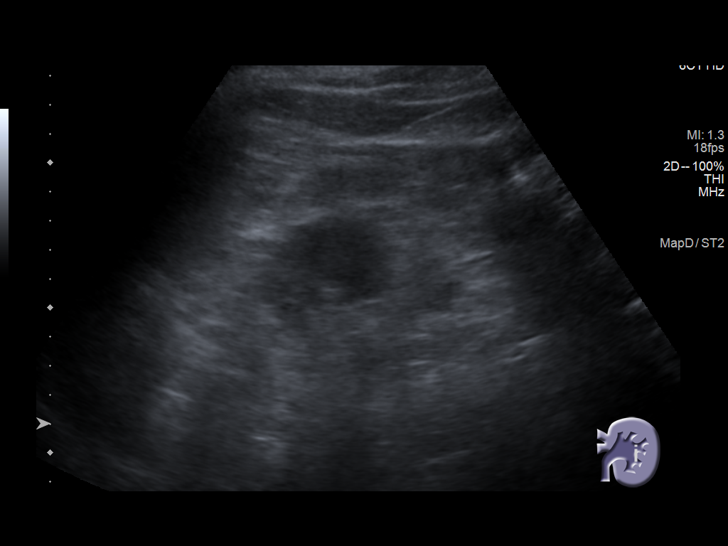
[im 45/67]
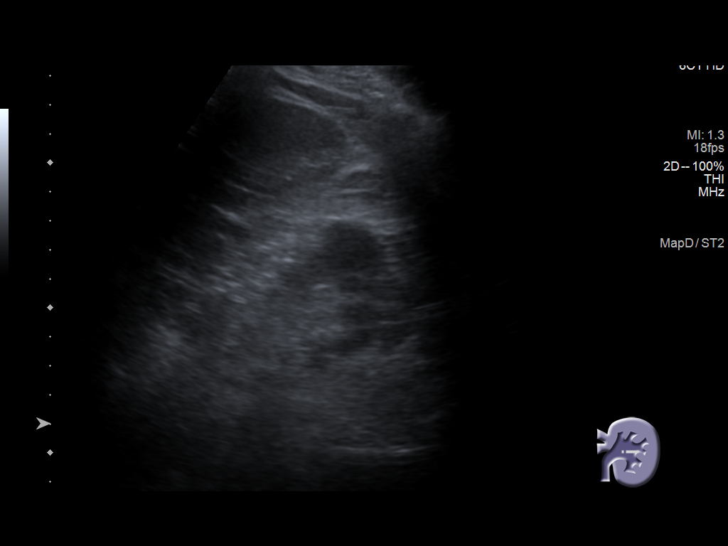
[im 50/67]
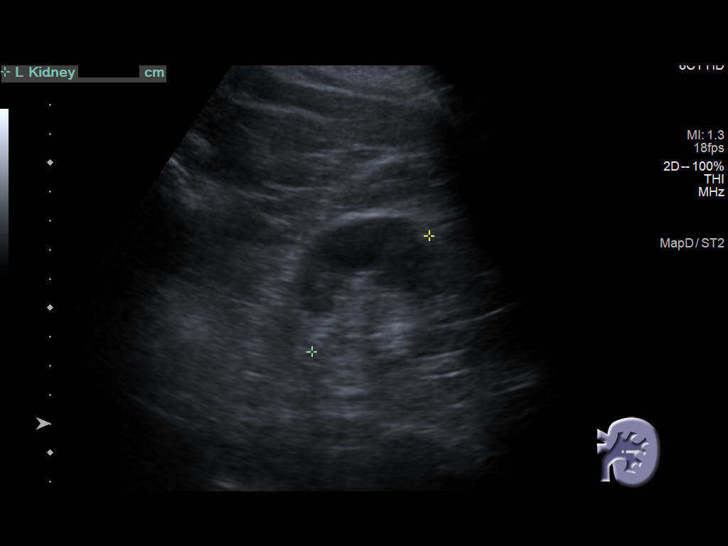
[im 56/67]
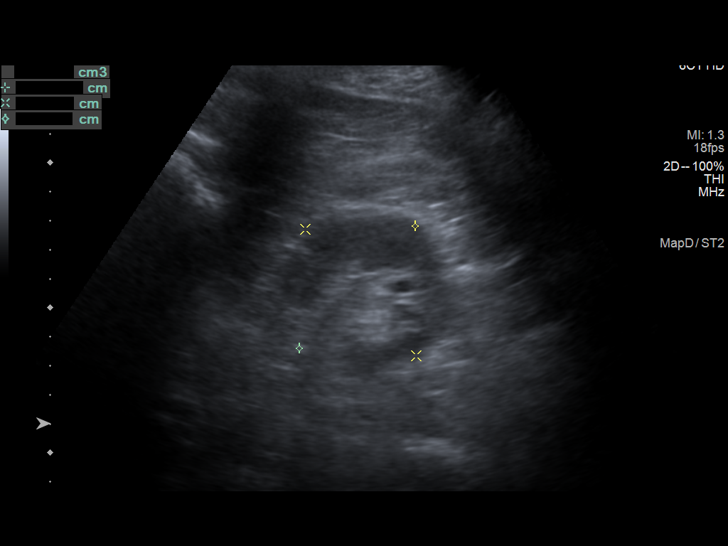
[im 61/67]
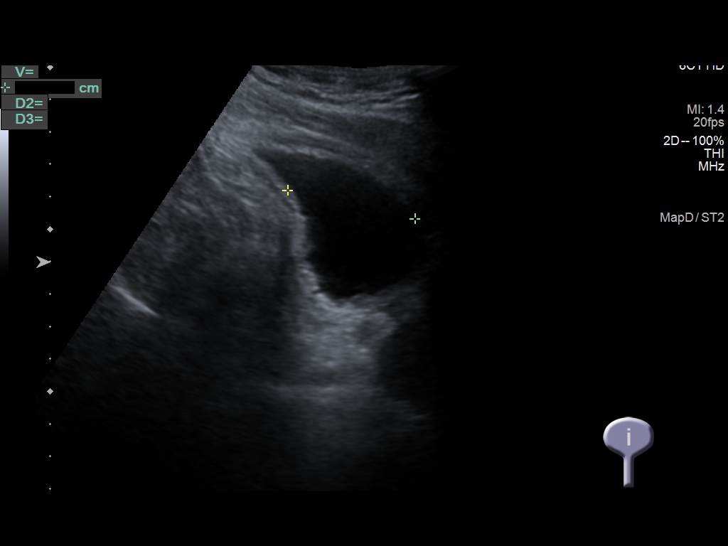
[im 67/67]
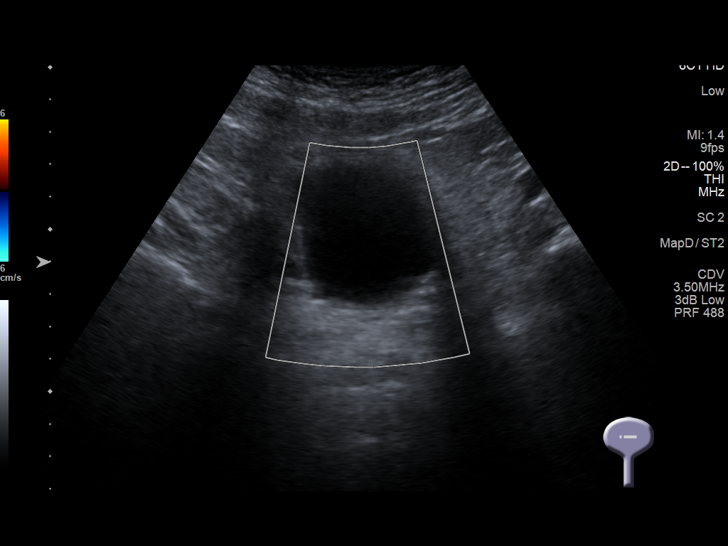

[14 of 25 positions shown; findings below may reference images not displayed]

FINDINGS: Right Kidney:

Renal measurements: 10.1 x 5.2 x 5.4 centimeters = volume: 153 mL .
Echogenicity within normal limits. No mass or hydronephrosis
visualized.

Left Kidney:

Renal measurements: 10.7 x 5.8 x 5.8 centimeters = volume: 189 mL.
Echogenicity within normal limits. No mass or hydronephrosis
visualized.

Bladder:

Diminutive.  Appears normal for degree of bladder distention.

Other:

Echogenic liver (image 3).
IMPRESSION: 1. Normal ultrasound appearance of both kidneys and the urinary
bladder.
2. Evidence of hepatic steatosis.

## 2021-12-25 DIAGNOSIS — G4730 Sleep apnea, unspecified: Secondary | ICD-10-CM | POA: Insufficient documentation

## 2022-03-20 ENCOUNTER — Other Ambulatory Visit: Payer: Self-pay

## 2022-03-20 MED ORDER — AMLODIPINE BESYLATE 10 MG PO TABS: 10.0000 mg | ORAL_TABLET | Freq: Every day | ORAL | 1 refills | Status: DC

## 2022-03-20 NOTE — Telephone Encounter (Signed)
Requested medication (s) are due for refill today: yes  Requested medication (s) are on the active medication list: yes  Last refill:  09/11/21  Future visit scheduled: no  Notes to clinic:  Unable to refill per protocol, courtesy refill already given, routing for provider approval.      Requested Prescriptions  Pending Prescriptions Disp Refills   amLODipine (NORVASC) 10 MG tablet 90 tablet 1    Sig: Take 1 tablet (10 mg total) by mouth daily. PT NEEDS OV FOR FURTHER REFILLS     Cardiovascular: Calcium Channel Blockers 2 Failed - 03/20/2022 11:11 AM      Failed - Valid encounter within last 6 months    Recent Outpatient Visits           6 months ago General medical exam   [facility] Donita Brooks, MD   1 year ago Prostate cancer screening   [facility] Donita Brooks, MD   1 year ago External hemorrhoid, thrombosed   [facility] Tanya Nones Priscille Heidelberg, MD   2 years ago Diarrhea, unspecified type   [facility] Elmore Guise, FNP   3 years ago Renal insufficiency   [facility] Pickard, Priscille Heidelberg, MD              Passed - Last BP in normal range    BP Readings from Last 1 Encounters:  08/29/21 116/70         Passed - Last Heart Rate in normal range    Pulse Readings from Last 1 Encounters:  08/29/21 (!) 59

## 2022-03-20 NOTE — Telephone Encounter (Signed)
  Prescription Request  03/20/2022  Is this a "Controlled Substance" medicine? No  LOV: 10/13/2021   What is the name of the medication or equipment? amLODipine (NORVASC) 10 MG tablet [[id number]]   Have you contacted your pharmacy to request a refill? Yes   Which pharmacy would you like this sent to?  [id number] [place], [place] - [address] [place] [address] Phone: [phone] Fax: [phone]   Patient notified that their request is being sent to the clinical staff for review and that they should receive a response within 2 business days.   Please advise at [phone]

## 2022-04-23 ENCOUNTER — Other Ambulatory Visit: Payer: Self-pay

## 2022-04-23 DIAGNOSIS — E7800 Pure hypercholesterolemia, unspecified: Secondary | ICD-10-CM

## 2022-04-23 MED ORDER — ATORVASTATIN CALCIUM 10 MG PO TABS: 10.0000 mg | ORAL_TABLET | Freq: Every day | ORAL | 3 refills | Status: DC

## 2022-06-09 ENCOUNTER — Other Ambulatory Visit: Payer: Self-pay | Admitting: Family Medicine

## 2022-06-10 NOTE — Telephone Encounter (Signed)
Requested Prescriptions  Pending Prescriptions Disp Refills   losartan (COZAAR) 100 MG tablet [Pharmacy Med Name: LOSARTAN POTASSIUM 100 MG TAB] 45 tablet 0    Sig: TAKE 1/2 TABLET BY MOUTH DAILY     Cardiovascular:  Angiotensin Receptor Blockers Failed - 06/09/2022  1:35 AM      Failed - Cr in normal range and within 180 days    Creat  Date Value Ref Range Status  08/29/2021 1.00 0.70 - 1.35 mg/dL Final         Failed - K in normal range and within 180 days    Potassium  Date Value Ref Range Status  08/29/2021 4.7 3.5 - 5.3 mmol/L Final         Failed - Valid encounter within last 6 months    Recent Outpatient Visits           9 months ago General medical exam   [place] Susy Frizzle, MD   1 year ago Prostate cancer screening   [place] Susy Frizzle, MD   1 year ago External hemorrhoid, thrombosed   [place] Susy Frizzle, MD   2 years ago Diarrhea, unspecified type   [place], [place], [place]   3 years ago Renal insufficiency   [place], Cammie Mcgee, MD              Passed - Patient is not pregnant      Passed - Last BP in normal range    BP Readings from Last 1 Encounters:  08/29/21 116/70

## 2022-07-29 ENCOUNTER — Other Ambulatory Visit: Payer: Self-pay

## 2022-07-29 ENCOUNTER — Telehealth: Payer: Self-pay | Admitting: Family Medicine

## 2022-07-29 DIAGNOSIS — I10 Essential (primary) hypertension: Secondary | ICD-10-CM

## 2022-07-29 MED ORDER — AMLODIPINE BESYLATE 10 MG PO TABS: 10.0000 mg | ORAL_TABLET | Freq: Every day | ORAL | 0 refills | Status: DC

## 2022-07-29 NOTE — Telephone Encounter (Signed)
Patient called to follow up on request to change pharmacy permanently.  Moving forward, patient requesting for all scripts to be sent to [facility] on [address] in [place].  Currently needs a refill of amLODipine (NORVASC) 10 MG tablet [id number]   Patient only has 3 pills left.   Please advise with questions [phone].

## 2022-08-20 ENCOUNTER — Other Ambulatory Visit: Payer: Self-pay

## 2022-08-20 ENCOUNTER — Telehealth: Payer: Self-pay

## 2022-08-20 DIAGNOSIS — E7800 Pure hypercholesterolemia, unspecified: Secondary | ICD-10-CM

## 2022-08-20 DIAGNOSIS — I10 Essential (primary) hypertension: Secondary | ICD-10-CM

## 2022-08-20 MED ORDER — LOSARTAN POTASSIUM 100 MG PO TABS: 50.0000 mg | ORAL_TABLET | Freq: Every day | ORAL | 1 refills | Status: DC

## 2022-08-20 MED ORDER — ATORVASTATIN CALCIUM 10 MG PO TABS: 10.0000 mg | ORAL_TABLET | Freq: Every day | ORAL | 1 refills | Status: DC

## 2022-08-20 MED ORDER — AMLODIPINE BESYLATE 10 MG PO TABS: 10.0000 mg | ORAL_TABLET | Freq: Every day | ORAL | 1 refills | Status: DC

## 2022-08-20 NOTE — Telephone Encounter (Signed)
Pt called in stating that he requested a change in pharmacies but has yet to receive his refills. Pt states that he is almost out of all meds. Pt would like for meds to be resent to the new pharmacy he requested please.   LOV: 08/29/21 CPE  PHARMACY: [facility] on [address] in [place].  CB#: [phone]

## 2022-09-09 ENCOUNTER — Other Ambulatory Visit: Payer: Self-pay | Admitting: Family Medicine

## 2022-09-09 DIAGNOSIS — I10 Essential (primary) hypertension: Secondary | ICD-10-CM

## 2022-09-09 NOTE — Telephone Encounter (Signed)
Requested Prescriptions  Pending Prescriptions Disp Refills   losartan (COZAAR) 100 MG tablet [Pharmacy Med Name: LOSARTAN POTASSIUM 100 MG TAB] 45 tablet 1    Sig: TAKE 1/2 TABLET BY MOUTH DAILY     Cardiovascular:  Angiotensin Receptor Blockers Failed - 09/09/2022  2:33 AM      Failed - Cr in normal range and within 180 days    Creat  Date Value Ref Range Status  08/29/2021 1.00 0.70 - 1.35 mg/dL Final         Failed - K in normal range and within 180 days    Potassium  Date Value Ref Range Status  08/29/2021 4.7 3.5 - 5.3 mmol/L Final         Failed - Valid encounter within last 6 months    Recent Outpatient Visits           1 year ago General medical exam   [facility] Donita Brooks, MD   2 years ago Prostate cancer screening   [facility] Donita Brooks, MD   2 years ago External hemorrhoid, thrombosed   [facility] Tanya Nones, Priscille Heidelberg, MD   2 years ago Diarrhea, unspecified type   [facility] Elmore Guise, FNP   3 years ago Renal insufficiency   [facility] Pickard, Priscille Heidelberg, MD              Passed - Patient is not pregnant      Passed - Last BP in normal range    BP Readings from Last 1 Encounters:  08/29/21 116/70

## 2022-10-08 ENCOUNTER — Encounter: Payer: Self-pay | Admitting: Family Medicine

## 2022-10-08 ENCOUNTER — Ambulatory Visit: Payer: No Typology Code available for payment source | Admitting: Family Medicine

## 2022-10-08 VITALS — BP 120/78 | HR 67 | Temp 97.8°F | Ht 67.0 in | Wt 181.0 lb

## 2022-10-08 DIAGNOSIS — S41112A Laceration without foreign body of left upper arm, initial encounter: Secondary | ICD-10-CM | POA: Diagnosis not present

## 2022-10-08 NOTE — Progress Notes (Signed)
Subjective:  HPI: Devon Riley is a 62 y.o. male presenting on 10/08/2022 for Wound Check (Pt c/o cut to L arm on metal box 1.5 weeks ago. Center area of cut not completely healed and has been red. Pt states last Tdap was about 8 years ago. )   Wound Check   Patient is in today for a cut to his left arm that is red. He reports after the cut the middle area of the wound turned red and swollen and was draining pus. He has been keeping the area clean and dry and applying antibacterial ointment. It has since healed, reduced in size, and stopped draining. He denies fever, chills, sweats, body aches. He reports his last tetanus shot was 8 years ago.  Review of Systems  All other systems reviewed and are negative.   Relevant past medical history reviewed and updated as indicated.   Past Medical History:  Diagnosis Date   Abnormal EKG    Benign essential HTN    Hypertension    Phreesia 08/13/2020   SBO (small bowel obstruction) (HCC)      Past Surgical History:  Procedure Laterality Date   VASECTOMY      Allergies and medications reviewed and updated.   Current Outpatient Medications:    amLODipine (NORVASC) 10 MG tablet, Take 1 tablet (10 mg total) by mouth daily., Disp: 90 tablet, Rfl: 1   atorvastatin (LIPITOR) 10 MG tablet, Take 1 tablet (10 mg total) by mouth daily., Disp: 90 tablet, Rfl: 1   losartan (COZAAR) 100 MG tablet, Take 0.5 tablets (50 mg total) by mouth daily., Disp: 45 tablet, Rfl: 1  No Known Allergies  Objective:   BP 120/78   Pulse 67   Temp 97.8 F (36.6 C)   Ht 5\' 7"  (1.702 m)   Wt 181 lb (82.1 kg)   SpO2 99%   BMI 28.35 kg/m      10/08/2022   11:31 AM 08/29/2021   10:26 AM 08/13/2020    3:08 PM  Vitals with BMI  Height 5\' 7"  5\' 7"  5\' 7"   Weight 181 lbs 183 lbs 6 oz 186 lbs  BMI 28.34 28.72 29.12  Systolic 120 116 191  Diastolic 78 70 64  Pulse 67 59 96     Physical Exam Vitals and nursing note reviewed.  Constitutional:       Appearance: Normal appearance. He is normal weight.  HENT:     Head: Normocephalic and atraumatic.  Skin:    General: Skin is warm and dry.     Capillary Refill: Capillary refill takes less than 2 seconds.     Findings: Laceration present.          Comments: 0.5 inch laceration, pink with granulation tissue.  Neurological:     General: No focal deficit present.     Mental Status: He is alert and oriented to person, place, and time. Mental status is at baseline.  Psychiatric:        Mood and Affect: Mood normal.        Behavior: Behavior normal.        Thought Content: Thought content normal.        Judgment: Judgment normal.     Assessment & Plan:  Laceration of left upper extremity, initial encounter Assessment & Plan: Wound appears to be healing nicely with no warmth, edema, or erythema. No systemic symptoms. Tetanus UTD. Instructed to keep clean and dry, continue antibiotic ointment. Return to office if symptoms  worsen.      Follow up plan: Return if symptoms worsen or fail to improve.  Park Meo, FNP

## 2022-10-08 NOTE — Assessment & Plan Note (Signed)
Wound appears to be healing nicely with no warmth, edema, or erythema. No systemic symptoms. Tetanus UTD. Instructed to keep clean and dry, continue antibiotic ointment. Return to office if symptoms worsen.

## 2022-12-30 ENCOUNTER — Other Ambulatory Visit: Payer: Self-pay | Admitting: Family Medicine

## 2022-12-30 DIAGNOSIS — E7800 Pure hypercholesterolemia, unspecified: Secondary | ICD-10-CM

## 2022-12-30 DIAGNOSIS — I10 Essential (primary) hypertension: Secondary | ICD-10-CM

## 2022-12-31 NOTE — Telephone Encounter (Signed)
Refilled 12/31/22, duplicate request.  Requested Prescriptions  Pending Prescriptions Disp Refills   atorvastatin (LIPITOR) 10 MG tablet [Pharmacy Med Name: ATORVASTATIN 10MG  TABLETS] 90 tablet 1    Sig: TAKE 1 TABLET BY MOUTH DAILY.     Cardiovascular:  Antilipid - Statins Failed - 12/30/2022  1:29 PM      Failed - Valid encounter within last 12 months    Recent Outpatient Visits           1 year ago General medical exam   [facility] Donita Brooks, MD   2 years ago Prostate cancer screening   [facility] Tanya Nones Priscille Heidelberg, MD   2 years ago External hemorrhoid, thrombosed   [facility] Medicine Tanya Nones, Priscille Heidelberg, MD   3 years ago Diarrhea, unspecified type   [facility] Medicine Elmore Guise, FNP   3 years ago Renal insufficiency   [facility] Medicine Donita Brooks, MD              Failed - Lipid Panel in normal range within the last 12 months    Cholesterol  Date Value Ref Range Status  08/29/2021 153 <200 mg/dL Final   LDL Cholesterol (Calc)  Date Value Ref Range Status  08/29/2021 86 mg/dL (calc) Final    Comment:    Reference range: <100 . Desirable range <100 mg/dL for primary prevention;   <70 mg/dL for patients with CHD or diabetic patients  with > or = 2 CHD risk factors. Marland Kitchen LDL-C is now calculated using the Martin-Hopkins  calculation, which is a validated novel method providing  better accuracy than the Friedewald equation in the  estimation of LDL-C.  Horald Pollen et al. Lenox Ahr. 2956;213(08): 2061-2068  (http://education.[facility].com/faq/FAQ164)    HDL  Date Value Ref Range Status  08/29/2021 49 > OR = 40 mg/dL Final   Triglycerides  Date Value Ref Range Status  08/29/2021 89 <150 mg/dL Final         Passed - Patient is not pregnant

## 2022-12-31 NOTE — Telephone Encounter (Signed)
Requested Prescriptions  Pending Prescriptions Disp Refills   atorvastatin (LIPITOR) 10 MG tablet [Pharmacy Med Name: ATORVASTATIN 10MG  TABLETS] 90 tablet 1    Sig: TAKE 1 TABLET(10 MG) BY MOUTH DAILY     Cardiovascular:  Antilipid - Statins Failed - 12/30/2022 12:48 PM      Failed - Valid encounter within last 12 months    Recent Outpatient Visits           1 year ago General medical exam   [facility] Donita Brooks, MD   2 years ago Prostate cancer screening   [facility] Donita Brooks, MD   2 years ago External hemorrhoid, thrombosed   [facility] Tanya Nones, Priscille Heidelberg, MD   3 years ago Diarrhea, unspecified type   [facility] Medicine Elmore Guise, FNP   3 years ago Renal insufficiency   [facility] Medicine Tanya Nones, Priscille Heidelberg, MD              Failed - Lipid Panel in normal range within the last 12 months    Cholesterol  Date Value Ref Range Status  08/29/2021 153 <200 mg/dL Final   LDL Cholesterol (Calc)  Date Value Ref Range Status  08/29/2021 86 mg/dL (calc) Final    Comment:    Reference range: <100 . Desirable range <100 mg/dL for primary prevention;   <70 mg/dL for patients with CHD or diabetic patients  with > or = 2 CHD risk factors. Marland Kitchen LDL-C is now calculated using the Martin-Hopkins  calculation, which is a validated novel method providing  better accuracy than the Friedewald equation in the  estimation of LDL-C.  Horald Pollen et al. Lenox Ahr. 2706;237(62): 2061-2068  (http://education.[facility].com/faq/FAQ164)    HDL  Date Value Ref Range Status  08/29/2021 49 > OR = 40 mg/dL Final   Triglycerides  Date Value Ref Range Status  08/29/2021 89 <150 mg/dL Final         Passed - Patient is not pregnant       amLODipine (NORVASC) 10 MG tablet [Pharmacy Med Name: AMLODIPINE BESYLATE 10MG  TABLETS] 90 tablet 1    Sig: TAKE 1 TABLET(10 MG) BY MOUTH DAILY     Cardiovascular:  Calcium Channel Blockers 2 Failed - 12/30/2022 12:48 PM      Failed - Valid encounter within last 6 months    Recent Outpatient Visits           1 year ago General medical exam   [facility] Donita Brooks, MD   2 years ago Prostate cancer screening   [facility] Donita Brooks, MD   2 years ago External hemorrhoid, thrombosed   [facility] Medicine Tanya Nones, Priscille Heidelberg, MD   3 years ago Diarrhea, unspecified type   [facility] Medicine Elmore Guise, FNP   3 years ago Renal insufficiency   [facility] Medicine Pickard, Priscille Heidelberg, MD              Passed - Last BP in normal range    BP Readings from Last 1 Encounters:  10/08/22 120/78         Passed - Last Heart Rate in normal range    Pulse Readings from Last 1 Encounters:  10/08/22 67

## 2023-02-25 ENCOUNTER — Other Ambulatory Visit: Payer: No Typology Code available for payment source

## 2023-02-25 ENCOUNTER — Other Ambulatory Visit: Payer: Self-pay | Admitting: Family Medicine

## 2023-02-25 DIAGNOSIS — E7800 Pure hypercholesterolemia, unspecified: Secondary | ICD-10-CM

## 2023-02-25 DIAGNOSIS — I10 Essential (primary) hypertension: Secondary | ICD-10-CM

## 2023-02-25 DIAGNOSIS — Z125 Encounter for screening for malignant neoplasm of prostate: Secondary | ICD-10-CM

## 2023-02-25 DIAGNOSIS — R5383 Other fatigue: Secondary | ICD-10-CM

## 2023-02-25 DIAGNOSIS — I422 Other hypertrophic cardiomyopathy: Secondary | ICD-10-CM

## 2023-02-26 ENCOUNTER — Other Ambulatory Visit: Payer: PRIVATE HEALTH INSURANCE

## 2023-02-26 LAB — LIPID PANEL
Cholesterol: 158 mg/dL (ref <200)
HDL: 44 mg/dL (ref >=40)
LDL Cholesterol (Calc): 94 mg/dL
Non-HDL Cholesterol (Calc): 114 mg/dL (ref <130)
Total CHOL/HDL Ratio: 3.6 (calc) (ref <5.0)
Triglycerides: 103 mg/dL (ref <150)

## 2023-02-26 LAB — COMPLETE METABOLIC PANEL WITH GFR
AG Ratio: 1.4 (calc) (ref 1.0–2.5)
ALT: 16 U/L (ref 9–46)
AST: 18 U/L (ref 10–35)
Albumin: 4.3 g/dL (ref 3.6–5.1)
Alkaline phosphatase (APISO): 54 U/L (ref 35–144)
BUN: 19 mg/dL (ref 7–25)
CO2: 24 mmol/L (ref 20–32)
Calcium: 9.5 mg/dL (ref 8.6–10.3)
Chloride: 107 mmol/L (ref 98–110)
Creat: 1.23 mg/dL (ref 0.70–1.35)
Globulin: 3 g/dL (ref 1.9–3.7)
Glucose, Bld: 108 mg/dL — ABNORMAL HIGH (ref 65–99)
Potassium: 4.8 mmol/L (ref 3.5–5.3)
Sodium: 138 mmol/L (ref 135–146)
Total Bilirubin: 0.9 mg/dL (ref 0.2–1.2)
Total Protein: 7.3 g/dL (ref 6.1–8.1)
eGFR: 66 mL/min/{1.73_m2} (ref >=60)

## 2023-02-26 LAB — CBC WITH DIFFERENTIAL/PLATELET
Absolute Lymphocytes: 2511 {cells}/uL (ref 850–3900)
Absolute Monocytes: 664 {cells}/uL (ref 200–950)
Basophils Absolute: 41 {cells}/uL (ref 0–200)
Basophils Relative: 0.5 %
Eosinophils Absolute: 81 {cells}/uL (ref 15–500)
Eosinophils Relative: 1 %
HCT: 40.8 % (ref 38.5–50.0)
Hemoglobin: 13.4 g/dL (ref 13.2–17.1)
MCH: 31.5 pg (ref 27.0–33.0)
MCHC: 32.8 g/dL (ref 32.0–36.0)
MCV: 96 fL (ref 80.0–100.0)
MPV: 10.7 fL (ref 7.5–12.5)
Monocytes Relative: 8.2 %
Neutro Abs: 4803 {cells}/uL (ref 1500–7800)
Neutrophils Relative %: 59.3 %
Platelets: 217 10*3/uL (ref 140–400)
RBC: 4.25 10*6/uL (ref 4.20–5.80)
RDW: 12.1 % (ref 11.0–15.0)
Total Lymphocyte: 31 %
WBC: 8.1 10*3/uL (ref 3.8–10.8)

## 2023-02-26 LAB — TSH: TSH: 3.09 m[IU]/L (ref 0.40–4.50)

## 2023-02-26 LAB — PSA: PSA: 0.82 ng/mL (ref <=4.00)

## 2023-02-26 LAB — TESTOSTERONE: Testosterone: 497 ng/dL (ref 250–827)

## 2023-02-26 NOTE — Telephone Encounter (Signed)
Requested Prescriptions  Refused Prescriptions Disp Refills   atorvastatin (LIPITOR) 10 MG tablet [Pharmacy Med Name: ATORVASTATIN 10MG  TABLETS] 90 tablet 1    Sig: TAKE 1 TABLET BY MOUTH DAILY.     Cardiovascular:  Antilipid - Statins Failed - 02/25/2023  4:20 AM      Failed - Valid encounter within last 12 months    Recent Outpatient Visits           1 year ago General medical exam   [facility] Donita Brooks, MD   2 years ago Prostate cancer screening   [facility] Tanya Nones Priscille Heidelberg, MD   2 years ago External hemorrhoid, thrombosed   [facility] Medicine Tanya Nones, Priscille Heidelberg, MD   3 years ago Diarrhea, unspecified type   [facility] Medicine Elmore Guise, FNP   3 years ago Renal insufficiency   [facility] Pickard, Priscille Heidelberg, MD       Future Appointments             In 3 days Tanya Nones, Priscille Heidelberg, MD [facility]            Failed - Lipid Panel in normal range within the last 12 months    Cholesterol  Date Value Ref Range Status  02/25/2023 158 <200 mg/dL Final   LDL Cholesterol (Calc)  Date Value Ref Range Status  02/25/2023 94 mg/dL (calc) Final    Comment:    Reference range: <100 . Desirable range <100 mg/dL for primary prevention;   <70 mg/dL for patients with CHD or diabetic patients  with > or = 2 CHD risk factors. Marland Kitchen LDL-C is now calculated using the Martin-Hopkins  calculation, which is a validated novel method providing  better accuracy than the Friedewald equation in the  estimation of LDL-C.  Horald Pollen et al. Lenox Ahr. 2130;865(78): 2061-2068  (http://education.[facility].com/faq/FAQ164)    HDL  Date Value Ref Range Status  02/25/2023 44 > OR = 40 mg/dL Final   Triglycerides  Date Value Ref Range Status  02/25/2023 103 <150 mg/dL Final         Passed - Patient is not pregnant

## 2023-03-01 ENCOUNTER — Ambulatory Visit (INDEPENDENT_AMBULATORY_CARE_PROVIDER_SITE_OTHER): Payer: No Typology Code available for payment source | Admitting: Family Medicine

## 2023-03-01 VITALS — BP 130/80 | HR 66 | Temp 98.8°F | Ht 67.0 in | Wt 188.5 lb

## 2023-03-01 DIAGNOSIS — I10 Essential (primary) hypertension: Secondary | ICD-10-CM | POA: Diagnosis not present

## 2023-03-01 DIAGNOSIS — Z0001 Encounter for general adult medical examination with abnormal findings: Secondary | ICD-10-CM | POA: Diagnosis not present

## 2023-03-01 DIAGNOSIS — E7800 Pure hypercholesterolemia, unspecified: Secondary | ICD-10-CM | POA: Diagnosis not present

## 2023-03-01 DIAGNOSIS — Z0000 Encounter for general adult medical examination without abnormal findings: Secondary | ICD-10-CM

## 2023-03-01 MED ORDER — LOSARTAN POTASSIUM 100 MG PO TABS: 50.0000 mg | ORAL_TABLET | Freq: Every day | ORAL | 3 refills | Status: DC

## 2023-03-01 MED ORDER — AMLODIPINE BESYLATE 10 MG PO TABS: 10.0000 mg | ORAL_TABLET | Freq: Every day | ORAL | 3 refills | Status: DC

## 2023-03-01 MED ORDER — ATORVASTATIN CALCIUM 10 MG PO TABS: 10.0000 mg | ORAL_TABLET | Freq: Every day | ORAL | 3 refills | Status: DC

## 2023-03-01 NOTE — Progress Notes (Signed)
Subjective:    Patient ID: Devon Riley, male    DOB: 1961-03-14, 62 y.o.   MRN: [id number]  HPI Patient is a very pleasant 62 year old Caucasian male here today for complete physical exam.  Last colonoscopy was in 2016 and was normal. Due again in 2026.  He is due for the shingles vaccine, COVID-vaccine, flu shot, and a tetanus shot.  He declines these today.  Overall he is doing well.  He denies any chest pain shortness of breath or dyspnea on exertion.  His most recent lab work is listed below. Lab on 02/25/2023  Component Date Value Ref Range Status   WBC 02/25/2023 8.1  3.8 - 10.8 Thousand/uL Final   RBC 02/25/2023 4.25  4.20 - 5.80 Million/uL Final   Hemoglobin 02/25/2023 13.4  13.2 - 17.1 g/dL Final   HCT 95/28/4132 40.8  38.5 - 50.0 % Final   MCV 02/25/2023 96.0  80.0 - 100.0 fL Final   MCH 02/25/2023 31.5  27.0 - 33.0 pg Final   MCHC 02/25/2023 32.8  32.0 - 36.0 g/dL Final   Comment: For adults, a slight decrease in the calculated MCHC value (in the range of 30 to 32 g/dL) is most likely not clinically significant; however, it should be interpreted with caution in correlation with other red cell parameters and the patient's clinical condition.    RDW 02/25/2023 12.1  11.0 - 15.0 % Final   Platelets 02/25/2023 217  140 - 400 Thousand/uL Final   MPV 02/25/2023 10.7  7.5 - 12.5 fL Final   Neutro Abs 02/25/2023 4,803  1,500 - 7,800 cells/uL Final   Absolute Lymphocytes 02/25/2023 2,511  850 - 3,900 cells/uL Final   Absolute Monocytes 02/25/2023 664  200 - 950 cells/uL Final   Eosinophils Absolute 02/25/2023 81  15 - 500 cells/uL Final   Basophils Absolute 02/25/2023 41  0 - 200 cells/uL Final   Neutrophils Relative % 02/25/2023 59.3  % Final   Total Lymphocyte 02/25/2023 31.0  % Final   Monocytes Relative 02/25/2023 8.2  % Final   Eosinophils Relative 02/25/2023 1.0  % Final   Basophils Relative 02/25/2023 0.5  % Final   Glucose, Bld 02/25/2023 108 (H)  65 - 99 mg/dL Final    Comment: .            Fasting reference interval . For someone without known diabetes, a glucose value between 100 and 125 mg/dL is consistent with prediabetes and should be confirmed with a follow-up test. .    BUN 02/25/2023 19  7 - 25 mg/dL Final   Creat 44/05/270 1.23  0.70 - 1.35 mg/dL Final   eGFR 53/66/4403 66  > OR = 60 mL/min/1.19m2 Final   BUN/Creatinine Ratio 02/25/2023 SEE NOTE:  6 - 22 (calc) Final   Comment:    Not Reported: BUN and Creatinine are within    reference range. .    Sodium 02/25/2023 138  135 - 146 mmol/L Final   Potassium 02/25/2023 4.8  3.5 - 5.3 mmol/L Final   Chloride 02/25/2023 107  98 - 110 mmol/L Final   CO2 02/25/2023 24  20 - 32 mmol/L Final   Calcium 02/25/2023 9.5  8.6 - 10.3 mg/dL Final   Total Protein 47/42/5956 7.3  6.1 - 8.1 g/dL Final   Albumin 38/75/6433 4.3  3.6 - 5.1 g/dL Final   Globulin 29/51/8841 3.0  1.9 - 3.7 g/dL (calc) Final   AG Ratio 02/25/2023 1.4  1.0 - 2.5 (calc)  Final   Total Bilirubin 02/25/2023 0.9  0.2 - 1.2 mg/dL Final   Alkaline phosphatase (APISO) 02/25/2023 54  35 - 144 U/L Final   AST 02/25/2023 18  10 - 35 U/L Final   ALT 02/25/2023 16  9 - 46 U/L Final   Cholesterol 02/25/2023 158  <200 mg/dL Final   HDL 32/44/0102 44  > OR = 40 mg/dL Final   Triglycerides 72/53/6644 103  <150 mg/dL Final   LDL Cholesterol (Calc) 02/25/2023 94  mg/dL (calc) Final   Comment: Reference range: <100 . Desirable range <100 mg/dL for primary prevention;   <70 mg/dL for patients with CHD or diabetic patients  with > or = 2 CHD risk factors. Marland Kitchen LDL-C is now calculated using the Martin-Hopkins  calculation, which is a validated novel method providing  better accuracy than the Friedewald equation in the  estimation of LDL-C.  Horald Pollen et al. Lenox Ahr. 0347;425(95): 2061-2068  (http://education.[facility].com/faq/FAQ164)    Total CHOL/HDL Ratio 02/25/2023 3.6  <6.3 (calc) Final   Non-HDL Cholesterol (Calc) 02/25/2023 114   <130 mg/dL (calc) Final   Comment: For patients with diabetes plus 1 major ASCVD risk  factor, treating to a non-HDL-C goal of <100 mg/dL  (LDL-C of <87 mg/dL) is considered a therapeutic  option.    TSH 02/25/2023 3.09  0.40 - 4.50 mIU/L Final   PSA 02/25/2023 0.82  < OR = 4.00 ng/mL Final   Comment: The total PSA value from this assay system is  standardized against the WHO standard. The test  result will be approximately 20% lower when compared  to the equimolar-standardized total PSA (Beckman  Coulter). Comparison of serial PSA results should be  interpreted with this fact in mind. . This test was performed using the Siemens  chemiluminescent method. Values obtained from  different assay methods cannot be used interchangeably. PSA levels, regardless of value, should not be interpreted as absolute evidence of the presence or absence of disease.    Testosterone 02/25/2023 497  250 - 827 ng/dL Final    Immunization History  Administered Date(s) Administered   PFIZER(Purple Top)SARS-COV-2 Vaccination 11/27/2019, 12/18/2019    Past Medical History:  Diagnosis Date   Abnormal EKG    Benign essential HTN    Hypertension    Phreesia 08/13/2020   SBO (small bowel obstruction) (HCC)    Past Surgical History:  Procedure Laterality Date   VASECTOMY     No current outpatient medications on file prior to visit.   No current facility-administered medications on file prior to visit.   No Known Allergies Social History   Socioeconomic History   Marital status: Divorced    Spouse name: Not on file   Number of children: 2   Years of education: Not on file   Highest education level: Not on file  Occupational History   Occupation: self-employed  Tobacco Use   Smoking status: Never   Smokeless tobacco: Never  Substance and Sexual Activity   Alcohol use: Yes    Comment: occassional, 6-10 beers on occasion   Drug use: No   Sexual activity: Not on file  Other Topics  Concern   Not on file  Social History Narrative   Not on file   Social Determinants of Health   Financial Resource Strain: Not on file  Food Insecurity: Not on file  Transportation Needs: Not on file  Physical Activity: Not on file  Stress: Not on file  Social Connections: Not on file  Intimate Partner  Violence: Not on file   Family History  Problem Relation Age of Onset   Heart disease Mother    Diabetes Mother    Cancer Mother        lung/liver   Bladder Cancer Father    Heart disease Father    Cancer Father        colon   Colon cancer Neg Hx    Stomach cancer Neg Hx    Rectal cancer Neg Hx    Liver cancer Neg Hx    Esophageal cancer Neg Hx       Review of Systems  All other systems reviewed and are negative.      Objective:   Physical Exam Vitals reviewed.  Constitutional:      General: He is not in acute distress.    Appearance: He is well-developed. He is not diaphoretic.  HENT:     Head: Normocephalic and atraumatic.     Right Ear: External ear normal.     Left Ear: External ear normal.     Nose: Nose normal.     Mouth/Throat:     Pharynx: No oropharyngeal exudate.  Eyes:     General: No scleral icterus.       Right eye: No discharge.        Left eye: No discharge.     Conjunctiva/sclera: Conjunctivae normal.     Pupils: Pupils are equal, round, and reactive to light.  Neck:     Thyroid: No thyromegaly.     Vascular: No JVD.     Trachea: No tracheal deviation.  Cardiovascular:     Rate and Rhythm: Normal rate and regular rhythm.     Heart sounds: Normal heart sounds. No murmur heard.    No friction rub. No gallop.  Pulmonary:     Effort: Pulmonary effort is normal. No respiratory distress.     Breath sounds: Normal breath sounds. No stridor. No wheezing or rales.  Chest:     Chest wall: No tenderness.  Abdominal:     General: Bowel sounds are normal. There is no distension.     Palpations: Abdomen is soft. There is no mass.      Tenderness: There is no abdominal tenderness. There is no guarding or rebound.  Musculoskeletal:     Cervical back: Normal range of motion and neck supple.  Lymphadenopathy:     Cervical: No cervical adenopathy.  Skin:    General: Skin is warm and dry.  Neurological:     Mental Status: He is alert and oriented to person, place, and time.     Cranial Nerves: No cranial nerve deficit.     Sensory: No sensory deficit.     Motor: No abnormal muscle tone.     Coordination: Coordination normal.     Deep Tendon Reflexes: Reflexes normal.         Assessment & Plan:  General medical exam  Benign essential HTN - Plan: amLODipine (NORVASC) 10 MG tablet, losartan (COZAAR) 100 MG tablet  Pure hypercholesterolemia - Plan: atorvastatin (LIPITOR) 10 MG tablet Blood pressure today is normal.  He has been off his blood pressure medication for more than 4 days.  I recommended that he would resume amlodipine but stay off losartan.  Recheck his blood pressure in 2 weeks.  If blood pressures greater than 1 be over 90 he consents are that time.  Cholesterol is excellent stable between.  Colonoscopy is up-to-date.  Prostate cancer screening is normal with an excellent  PSA.  Patient declines a flu shot, tetanus shot, COVID-vaccine, and a shingles shot.

## 2023-05-03 ENCOUNTER — Other Ambulatory Visit: Payer: Self-pay | Admitting: Family Medicine

## 2023-05-03 DIAGNOSIS — I10 Essential (primary) hypertension: Secondary | ICD-10-CM

## 2023-08-11 ENCOUNTER — Encounter: Payer: Self-pay | Admitting: Family Medicine

## 2024-02-14 ENCOUNTER — Other Ambulatory Visit: Payer: Self-pay | Admitting: Family Medicine

## 2024-02-14 DIAGNOSIS — E7800 Pure hypercholesterolemia, unspecified: Secondary | ICD-10-CM

## 2024-03-13 ENCOUNTER — Other Ambulatory Visit: Payer: Self-pay | Admitting: Family Medicine

## 2024-03-13 DIAGNOSIS — I10 Essential (primary) hypertension: Secondary | ICD-10-CM

## 2024-03-15 NOTE — Telephone Encounter (Signed)
 Requested Prescriptions  Pending Prescriptions Disp Refills   amLODipine  (NORVASC ) 10 MG tablet [Pharmacy Med Name: AMLODIPINE  BESYLATE 10MG  TABLETS] 90 tablet 0    Sig: TAKE 1 TABLET(10 MG) BY MOUTH DAILY     Cardiovascular: Calcium  Channel Blockers 2 Failed - 03/15/2024 12:10 PM      Failed - Valid encounter within last 6 months    Recent Outpatient Visits           1 year ago General medical exam   [facility] Devon Butler DASEN, Devon Riley   1 year ago Laceration of left upper extremity, initial encounter   [facility] Medicine Devon Jeoffrey RAMAN, Devon Riley              Passed - Last BP in normal range    BP Readings from Last 1 Encounters:  03/01/23 130/80         Passed - Last Heart Rate in normal range    Pulse Readings from Last 1 Encounters:  03/01/23 66

## 2024-03-20 ENCOUNTER — Other Ambulatory Visit: Payer: Self-pay | Admitting: Family Medicine

## 2024-03-20 DIAGNOSIS — E7800 Pure hypercholesterolemia, unspecified: Secondary | ICD-10-CM

## 2024-03-21 NOTE — Telephone Encounter (Signed)
 Requested medications are due for refill today.  yes  Requested medications are on the active medications list.  yes  Last refill. 03/01/2023 #90 3  Future visit scheduled.   no  Notes to clinic.  Labs are expired.    Requested Prescriptions  Pending Prescriptions Disp Refills   atorvastatin  (LIPITOR) 10 MG tablet [Pharmacy Med Name: ATORVASTATIN  10MG  TABLETS] 90 tablet 3    Sig: TAKE 1 TABLET(10 MG) BY MOUTH DAILY     Cardiovascular:  Antilipid - Statins Failed - 03/21/2024  4:38 PM      Failed - Valid encounter within last 12 months    Recent Outpatient Visits           1 year ago General medical exam   [facility] [name] DASEN, MD   1 year ago Laceration of left upper extremity, initial encounter   [facility] Kayla Gauze S, FNP              Failed - Lipid Panel in normal range within the last 12 months    Cholesterol  Date Value Ref Range Status  02/25/2023 158 <200 mg/dL Final   LDL Cholesterol (Calc)  Date Value Ref Range Status  02/25/2023 94 mg/dL (calc) Final    Comment:    Reference range: <100 . Desirable range <100 mg/dL for primary prevention;   <70 mg/dL for patients with CHD or diabetic patients  with > or = 2 CHD risk factors. SABRA LDL-C is now calculated using the Martin-Hopkins  calculation, which is a validated novel method providing  better accuracy than the Friedewald equation in the  estimation of LDL-C.  [name] et al. [name]. 7986;689(80): 2061-2068  (http://education.[facility].com/faq/FAQ164)    HDL  Date Value Ref Range Status  02/25/2023 44 > OR = 40 mg/dL Final   Triglycerides  Date Value Ref Range Status  02/25/2023 103 <150 mg/dL Final         Passed - Patient is not pregnant

## 2024-03-22 ENCOUNTER — Other Ambulatory Visit: Payer: Self-pay

## 2024-03-22 DIAGNOSIS — E7800 Pure hypercholesterolemia, unspecified: Secondary | ICD-10-CM

## 2024-03-22 NOTE — Telephone Encounter (Signed)
 2cd refill request: Prescription Request  03/22/2024  LOV: 03/01/23  What is the name of the medication or equipment? atorvastatin  (LIPITOR) 10 MG tablet [[id number]]   Have you contacted your pharmacy to request a refill? Yes   Which pharmacy would you like this sent to?  [facility] [phone] - [place], [place] - [address] AT [facility] [address] [place] [address] Phone: [phone] Fax: [phone]    Patient notified that their request is being sent to the clinical staff for review and that they should receive a response within 2 business days.   Please advise at [facility] [phone]

## 2024-03-24 ENCOUNTER — Other Ambulatory Visit: Payer: Self-pay | Admitting: Family Medicine

## 2024-03-24 DIAGNOSIS — E7800 Pure hypercholesterolemia, unspecified: Secondary | ICD-10-CM

## 2024-03-24 MED ORDER — ATORVASTATIN CALCIUM 10 MG PO TABS: 10.0000 mg | ORAL_TABLET | Freq: Every day | ORAL | 3 refills | Status: AC

## 2024-03-24 NOTE — Telephone Encounter (Signed)
 Copied from CRM #[id number]. Topic: Clinical - Medication Refill >> Mar 24, 2024  8:44 AM Pinkey ORN wrote: Medication: atorvastatin  (LIPITOR) 10 MG tablet  Has the patient contacted their pharmacy? Yes (Agent: If no, request that the patient contact the pharmacy for the refill. If patient does not wish to contact the pharmacy document the reason why and proceed with request.) (Agent: If yes, when and what did the pharmacy advise?)  This is the patient's preferred pharmacy:  [facility] [phone] - [place], [place] - [address] AT [facility] OF [address] [place] [address] Phone: [phone] Fax: [phone]  Is this the correct pharmacy for this prescription? Yes If no, delete pharmacy and type the correct one.   Has the prescription been filled recently? Yes  Is the patient out of the medication? Yes  Has the patient been seen for an appointment in the last year OR does the patient have an upcoming appointment? Yes  Can we respond through MyChart? Yes  Agent: Please be advised that Rx refills may take up to 3 business days. We ask that you follow-up with your pharmacy.

## 2024-03-24 NOTE — Telephone Encounter (Signed)
 Requested medications are due for refill today.  yes  Requested medications are on the active medications list.  yes  Last refill. 03/01/2023 #90 3 rf  Future visit scheduled.   yes  Notes to clinic.  Labs are expired.    Requested Prescriptions  Pending Prescriptions Disp Refills   atorvastatin  (LIPITOR) 10 MG tablet 90 tablet 3    Sig: Take 1 tablet (10 mg total) by mouth daily.     Cardiovascular:  Antilipid - Statins Failed - 03/24/2024  2:53 PM      Failed - Valid encounter within last 12 months    Recent Outpatient Visits           1 year ago General medical exam   [place] [facility] [name] DASEN, MD   1 year ago Laceration of left upper extremity, initial encounter   [place] [facility] [name], FNP              Failed - Lipid Panel in normal range within the last 12 months    Cholesterol  Date Value Ref Range Status  02/25/2023 158 <200 mg/dL Final   LDL Cholesterol (Calc)  Date Value Ref Range Status  02/25/2023 94 mg/dL (calc) Final    Comment:    Reference range: <100 . Desirable range <100 mg/dL for primary prevention;   <70 mg/dL for patients with CHD or diabetic patients  with > or = 2 CHD risk factors. SABRA LDL-C is now calculated using the Martin-Hopkins  calculation, which is a validated novel method providing  better accuracy than the Friedewald equation in the  estimation of LDL-C.  [name] et al. [name]. 7986;689(80): 2061-2068  (http://education.[facility].com/faq/FAQ164)    HDL  Date Value Ref Range Status  02/25/2023 44 > OR = 40 mg/dL Final   Triglycerides  Date Value Ref Range Status  02/25/2023 103 <150 mg/dL Final         Passed - Patient is not pregnant

## 2024-03-25 NOTE — Telephone Encounter (Signed)
 Refilled 03/24/14 # 90 with 3 refills. Requested Prescriptions  Refused Prescriptions Disp Refills   atorvastatin  (LIPITOR) 10 MG tablet 90 tablet 3    Sig: Take 1 tablet (10 mg total) by mouth daily.     Cardiovascular:  Antilipid - Statins Failed - 03/25/2024  4:49 PM      Failed - Valid encounter within last 12 months    Recent Outpatient Visits           1 year ago General medical exam   [place] [facility] [name] DASEN, MD   1 year ago Laceration of left upper extremity, initial encounter   [place] [facility] [name], FNP              Failed - Lipid Panel in normal range within the last 12 months    Cholesterol  Date Value Ref Range Status  02/25/2023 158 <200 mg/dL Final   LDL Cholesterol (Calc)  Date Value Ref Range Status  02/25/2023 94 mg/dL (calc) Final    Comment:    Reference range: <100 . Desirable range <100 mg/dL for primary prevention;   <70 mg/dL for patients with CHD or diabetic patients  with > or = 2 CHD risk factors. SABRA LDL-C is now calculated using the Martin-Hopkins  calculation, which is a validated novel method providing  better accuracy than the Friedewald equation in the  estimation of LDL-C.  [name] et al. [name]. 7986;689(80): 2061-2068  (http://education.[facility].com/faq/FAQ164)    HDL  Date Value Ref Range Status  02/25/2023 44 > OR = 40 mg/dL Final   Triglycerides  Date Value Ref Range Status  02/25/2023 103 <150 mg/dL Final         Passed - Patient is not pregnant

## 2024-05-16 ENCOUNTER — Other Ambulatory Visit: Payer: Self-pay | Admitting: Medical Genetics

## 2024-05-19 ENCOUNTER — Ambulatory Visit: Payer: Self-pay | Admitting: Family Medicine

## 2024-05-19 ENCOUNTER — Encounter: Payer: Self-pay | Admitting: Family Medicine

## 2024-05-19 VITALS — BP 116/62 | HR 58 | Temp 98.1°F | Ht 67.0 in | Wt 186.0 lb

## 2024-05-19 DIAGNOSIS — Z0000 Encounter for general adult medical examination without abnormal findings: Secondary | ICD-10-CM | POA: Diagnosis not present

## 2024-05-19 DIAGNOSIS — Z1211 Encounter for screening for malignant neoplasm of colon: Secondary | ICD-10-CM

## 2024-05-19 DIAGNOSIS — I10 Essential (primary) hypertension: Secondary | ICD-10-CM

## 2024-05-19 DIAGNOSIS — E7800 Pure hypercholesterolemia, unspecified: Secondary | ICD-10-CM

## 2024-05-19 DIAGNOSIS — Z125 Encounter for screening for malignant neoplasm of prostate: Secondary | ICD-10-CM | POA: Diagnosis not present

## 2024-05-19 NOTE — Progress Notes (Signed)
 "  Subjective:    Patient ID: Devon Riley, male    DOB: 04-Apr-1961, 64 y.o.   MRN: [id number]  HPI Patient is a very pleasant 64 year old Caucasian male here today for complete physical exam.  Last colonoscopy was in 2016 and was normal. Due again this year.  Patient is also due for prostate cancer screening with a PSA.  He is due for a flu shot.  He is due for his shingles shot.  He is due for a pneumonia shot.  And is also due for a tetanus shot.  We discussed these today and he declines any vaccinations.  His BMI is up to 29.  He weighs 186 pounds.  For his height, his goal weight would be 166 pounds.  He is interested in using Wegovy to help with weight loss.  Past Medical History:  Diagnosis Date   Abnormal EKG    Benign essential HTN    Hypertension    Phreesia 08/13/2020   SBO (small bowel obstruction) (HCC)    Past Surgical History:  Procedure Laterality Date   VASECTOMY     Current Outpatient Medications on File Prior to Visit  Medication Sig Dispense Refill   amLODipine  (NORVASC ) 10 MG tablet TAKE 1 TABLET(10 MG) BY MOUTH DAILY 90 tablet 0   atorvastatin  (LIPITOR) 10 MG tablet Take 1 tablet (10 mg total) by mouth daily. 90 tablet 3   losartan  (COZAAR ) 100 MG tablet TAKE 1/2 TABLET(50 MG) BY MOUTH DAILY 45 tablet 3   No current facility-administered medications on file prior to visit.   No Known Allergies Social History   Socioeconomic History   Marital status: Divorced    Spouse name: Not on file   Number of children: 2   Years of education: Not on file   Highest education level: 12th grade  Occupational History   Occupation: self-employed  Tobacco Use   Smoking status: Never   Smokeless tobacco: Never  Substance and Sexual Activity   Alcohol use: Yes    Comment: occassional, 6-10 beers on occasion   Drug use: No   Sexual activity: Not on file  Other Topics Concern   Not on file  Social History Narrative   Not on file   Social Drivers of Health    Tobacco Use: Low Risk (05/16/2024)   Patient History    Smoking Tobacco Use: Never    Smokeless Tobacco Use: Never    Passive Exposure: Not on file  Financial Resource Strain: Low Risk (05/16/2024)   Overall Financial Resource Strain (CARDIA)    Difficulty of Paying Living Expenses: Not hard at all  Food Insecurity: No Food Insecurity (05/16/2024)   Epic    Worried About Programme Researcher, Broadcasting/film/video in the Last Year: Never true    Ran Out of Food in the Last Year: Never true  Transportation Needs: No Transportation Needs (05/16/2024)   Epic    Lack of Transportation (Medical): No    Lack of Transportation (Non-Medical): No  Physical Activity: Insufficiently Active (05/16/2024)   Exercise Vital Sign    Days of Exercise per Week: 2 days    Minutes of Exercise per Session: 20 min  Stress: No Stress Concern Present (05/16/2024)   [employer] of Occupational Health - Occupational Stress Questionnaire    Feeling of Stress: Only a little  Social Connections: Unknown (05/16/2024)   Social Connection and Isolation Panel    Frequency of Communication with Friends and Family: More than three times a week  Frequency of Social Gatherings with Friends and Family: Twice a week    Attends Religious Services: Never    Database Administrator or Organizations: No    Attends Engineer, Structural: Not on file    Marital Status: Patient declined  Intimate Partner Violence: Not on file  Depression (PHQ2-9): Low Risk (03/01/2023)   Depression (PHQ2-9)    PHQ-2 Score: 0  Alcohol Screen: Medium Risk (05/16/2024)   Alcohol Screen    Last Alcohol Screening Score (AUDIT): 8  Housing: Unknown (05/16/2024)   Epic    Unable to Pay for Housing in the Last Year: No    Number of Times Moved in the Last Year: Not on file    Homeless in the Last Year: No  Utilities: Not on file  Health Literacy: Not on file   Family History  Problem Relation Age of Onset   Heart disease Mother    Diabetes Mother     Cancer Mother        lung/liver   Bladder Cancer Father    Heart disease Father    Cancer Father        colon   Colon cancer Neg Hx    Stomach cancer Neg Hx    Rectal cancer Neg Hx    Liver cancer Neg Hx    Esophageal cancer Neg Hx       Review of Systems  All other systems reviewed and are negative.      Objective:   Physical Exam Vitals reviewed.  Constitutional:      General: He is not in acute distress.    Appearance: He is well-developed. He is not diaphoretic.  HENT:     Head: Normocephalic and atraumatic.     Right Ear: External ear normal.     Left Ear: External ear normal.     Nose: Nose normal.     Mouth/Throat:     Pharynx: No oropharyngeal exudate.  Eyes:     General: No scleral icterus.       Right eye: No discharge.        Left eye: No discharge.     Conjunctiva/sclera: Conjunctivae normal.     Pupils: Pupils are equal, round, and reactive to light.  Neck:     Thyroid: No thyromegaly.     Vascular: No JVD.     Trachea: No tracheal deviation.  Cardiovascular:     Rate and Rhythm: Normal rate and regular rhythm.     Heart sounds: Normal heart sounds. No murmur heard.    No friction rub. No gallop.  Pulmonary:     Effort: Pulmonary effort is normal. No respiratory distress.     Breath sounds: Normal breath sounds. No stridor. No wheezing or rales.  Chest:     Chest wall: No tenderness.  Abdominal:     General: Bowel sounds are normal. There is no distension.     Palpations: Abdomen is soft. There is no mass.     Tenderness: There is no abdominal tenderness. There is no guarding or rebound.  Musculoskeletal:     Cervical back: Normal range of motion and neck supple.  Lymphadenopathy:     Cervical: No cervical adenopathy.  Skin:    General: Skin is warm and dry.  Neurological:     Mental Status: He is alert and oriented to person, place, and time.     Cranial Nerves: No cranial nerve deficit.     Sensory: No sensory deficit.  Motor: No  abnormal muscle tone.     Coordination: Coordination normal.     Deep Tendon Reflexes: Reflexes normal.         Assessment & Plan:  General medical exam - Plan: CBC with Differential/Platelet, Comprehensive metabolic panel with GFR, Lipid panel, PSA  Benign essential HTN - Plan: CBC with Differential/Platelet, Comprehensive metabolic panel with GFR, Lipid panel  Pure hypercholesterolemia - Plan: CBC with Differential/Platelet, Comprehensive metabolic panel with GFR, Lipid panel, CT CARDIAC SCORING (SELF PAY ONLY)  Prostate cancer screening - Plan: PSA  Colon cancer screening - Plan: Ambulatory referral to Gastroenterology I will schedule the patient for colonoscopy as this is due.  I will check a PSA to screen for prostate cancer.  His blood pressure today is outstanding.  I will check a CBC a CMP and a fasting lipid panel.  Goal LDL cholesterol is less than 100.  We discussed a coronary artery calcium  score and he would like to get that.  If significantly elevated I would lower his LDL goal to less than 70.  Recommended a pneumonia vaccine, shingles shot, flu shot, and tetanus shot.  Patient politely declines all of these.  We will start Wegovy 1.5 mg p.o. daily and increase the dose as tolerated "

## 2024-05-20 LAB — LIPID PANEL
Cholesterol: 141 mg/dL
HDL: 49 mg/dL
LDL Cholesterol (Calc): 77 mg/dL
Non-HDL Cholesterol (Calc): 92 mg/dL
Total CHOL/HDL Ratio: 2.9 (calc)
Triglycerides: 74 mg/dL

## 2024-05-20 LAB — COMPREHENSIVE METABOLIC PANEL WITH GFR
AG Ratio: 1.6 (calc) (ref 1.0–2.5)
ALT: 14 U/L (ref 9–46)
AST: 19 U/L (ref 10–35)
Albumin: 4.6 g/dL (ref 3.6–5.1)
Alkaline phosphatase (APISO): 46 U/L (ref 35–144)
BUN: 19 mg/dL (ref 7–25)
CO2: 22 mmol/L (ref 20–32)
Calcium: 9.1 mg/dL (ref 8.6–10.3)
Chloride: 104 mmol/L (ref 98–110)
Creat: 1.02 mg/dL (ref 0.70–1.35)
Globulin: 2.8 g/dL (ref 1.9–3.7)
Glucose, Bld: 87 mg/dL (ref 65–99)
Potassium: 4.5 mmol/L (ref 3.5–5.3)
Sodium: 136 mmol/L (ref 135–146)
Total Bilirubin: 1 mg/dL (ref 0.2–1.2)
Total Protein: 7.4 g/dL (ref 6.1–8.1)
eGFR: 82 mL/min/1.73m2

## 2024-05-20 LAB — CBC WITH DIFFERENTIAL/PLATELET
Absolute Lymphocytes: 2111 {cells}/uL (ref 850–3900)
Absolute Monocytes: 552 {cells}/uL (ref 200–950)
Basophils Absolute: 28 {cells}/uL (ref 0–200)
Basophils Relative: 0.4 %
Eosinophils Absolute: 41 {cells}/uL (ref 15–500)
Eosinophils Relative: 0.6 %
HCT: 39.6 % (ref 39.4–51.1)
Hemoglobin: 13.5 g/dL (ref 13.2–17.1)
MCH: 32.4 pg (ref 27.0–33.0)
MCHC: 34.1 g/dL (ref 31.6–35.4)
MCV: 95 fL (ref 81.4–101.7)
MPV: 11.4 fL (ref 7.5–12.5)
Monocytes Relative: 8 %
Neutro Abs: 4168 {cells}/uL (ref 1500–7800)
Neutrophils Relative %: 60.4 %
Platelets: 238 Thousand/uL (ref 140–400)
RBC: 4.17 Million/uL — ABNORMAL LOW (ref 4.20–5.80)
RDW: 12 % (ref 11.0–15.0)
Total Lymphocyte: 30.6 %
WBC: 6.9 Thousand/uL (ref 3.8–10.8)

## 2024-05-20 LAB — PSA: PSA: 1.02 ng/mL

## 2024-05-22 ENCOUNTER — Ambulatory Visit: Payer: Self-pay | Admitting: Family Medicine

## 2024-05-29 ENCOUNTER — Telehealth: Payer: Self-pay

## 2024-05-29 NOTE — Telephone Encounter (Signed)
 Copied from CRM #[id number]. Topic: Clinical - Prescription Issue >> May 29, 2024 10:27 AM Willma SAUNDERS wrote: Reason for CRM: Muhammed from [facility] pharmacy calling about a prescription for Wegovy , states patient has a paper prescription and has some questions in regards to the dose.  Muhammed can be reached at [phone]

## 2024-05-30 ENCOUNTER — Other Ambulatory Visit (HOSPITAL_COMMUNITY): Payer: Self-pay

## 2024-05-31 ENCOUNTER — Other Ambulatory Visit: Payer: Self-pay

## 2024-05-31 DIAGNOSIS — E7800 Pure hypercholesterolemia, unspecified: Secondary | ICD-10-CM

## 2024-05-31 DIAGNOSIS — E663 Overweight: Secondary | ICD-10-CM

## 2024-05-31 DIAGNOSIS — Z6829 Body mass index (BMI) 29.0-29.9, adult: Secondary | ICD-10-CM

## 2024-05-31 DIAGNOSIS — I422 Other hypertrophic cardiomyopathy: Secondary | ICD-10-CM

## 2024-05-31 DIAGNOSIS — I10 Essential (primary) hypertension: Secondary | ICD-10-CM

## 2024-05-31 MED ORDER — WEGOVY 1.5 MG PO TABS: 1.5000 mg | ORAL_TABLET | Freq: Every day | ORAL | 0 refills | Status: AC

## 2024-06-01 ENCOUNTER — Other Ambulatory Visit (HOSPITAL_COMMUNITY): Payer: Self-pay

## 2024-06-01 ENCOUNTER — Telehealth: Payer: Self-pay | Admitting: Pharmacy Technician

## 2024-06-01 NOTE — Telephone Encounter (Signed)
 Pharmacy Patient Advocate Encounter   Received notification from [facility] that prior authorization for Wegovy  1.5 mg tabs is required/requested.   Insurance verification completed.   The patient is insured through [employer].   Per test claim: Per test claim, medication is not covered due to plan/benefit exclusion, PA not submitted at this time

## 2024-06-06 ENCOUNTER — Other Ambulatory Visit (HOSPITAL_COMMUNITY): Payer: Self-pay

## 2024-06-30 ENCOUNTER — Other Ambulatory Visit (HOSPITAL_COMMUNITY)

## 2024-08-08 ENCOUNTER — Other Ambulatory Visit
